# Patient Record
Sex: Male | Born: 1960 | Race: White | Hispanic: No | Marital: Married | Smoking: Former smoker
Health system: Southern US, Community
[De-identification: ages and names within clinical notes are randomized; demographics above are authoritative.]

## PROBLEM LIST (undated history)

## (undated) DIAGNOSIS — F17200 Nicotine dependence, unspecified, uncomplicated: Secondary | ICD-10-CM

## (undated) DIAGNOSIS — E039 Hypothyroidism, unspecified: Secondary | ICD-10-CM

## (undated) DIAGNOSIS — N400 Enlarged prostate without lower urinary tract symptoms: Secondary | ICD-10-CM

## (undated) DIAGNOSIS — R7612 Nonspecific reaction to cell mediated immunity measurement of gamma interferon antigen response without active tuberculosis: Secondary | ICD-10-CM

## (undated) DIAGNOSIS — R112 Nausea with vomiting, unspecified: Secondary | ICD-10-CM

## (undated) DIAGNOSIS — R12 Heartburn: Secondary | ICD-10-CM

## (undated) DIAGNOSIS — E785 Hyperlipidemia, unspecified: Secondary | ICD-10-CM

## (undated) DIAGNOSIS — Z9889 Other specified postprocedural states: Secondary | ICD-10-CM

## (undated) DIAGNOSIS — Z87438 Personal history of other diseases of male genital organs: Secondary | ICD-10-CM

## (undated) DIAGNOSIS — T7840XA Allergy, unspecified, initial encounter: Secondary | ICD-10-CM

## (undated) HISTORY — DX: Other specified postprocedural states: Z98.890

## (undated) HISTORY — DX: Hypothyroidism, unspecified: E03.9

## (undated) HISTORY — PX: OTHER SURGICAL HISTORY: SHX169

## (undated) HISTORY — PX: KNEE SURGERY: SHX244

## (undated) HISTORY — DX: Nonspecific reaction to cell mediated immunity measurement of gamma interferon antigen response without active tuberculosis: R76.12

## (undated) HISTORY — PX: WRIST SURGERY: SHX841

## (undated) HISTORY — PX: FINGER SURGERY: SHX640

## (undated) HISTORY — DX: Heartburn: R12

## (undated) HISTORY — DX: Nicotine dependence, unspecified, uncomplicated: F17.200

## (undated) HISTORY — DX: Allergy, unspecified, initial encounter: T78.40XA

## (undated) HISTORY — DX: Personal history of other diseases of male genital organs: Z87.438

## (undated) HISTORY — DX: Hyperlipidemia, unspecified: E78.5

## (undated) HISTORY — DX: Benign prostatic hyperplasia without lower urinary tract symptoms: N40.0

## (undated) HISTORY — DX: Nausea with vomiting, unspecified: R11.2

---

## 2004-09-06 ENCOUNTER — Emergency Department: Payer: Self-pay | Admitting: Unknown Physician Specialty

## 2004-09-10 ENCOUNTER — Emergency Department: Payer: Self-pay | Admitting: Emergency Medicine

## 2005-03-11 ENCOUNTER — Emergency Department (HOSPITAL_COMMUNITY): Admission: EM | Admit: 2005-03-11 | Discharge: 2005-03-11 | Payer: Self-pay | Admitting: Emergency Medicine

## 2005-08-12 ENCOUNTER — Ambulatory Visit: Payer: Self-pay | Admitting: Internal Medicine

## 2008-01-31 ENCOUNTER — Ambulatory Visit: Payer: Self-pay | Admitting: Family Medicine

## 2008-02-04 ENCOUNTER — Ambulatory Visit: Payer: Self-pay | Admitting: Family Medicine

## 2008-02-04 LAB — CONVERTED CEMR LAB
BUN: 8 mg/dL (ref 6–23)
Bilirubin Urine: NEGATIVE
CO2: 29 meq/L (ref 19–32)
Calcium: 9.1 mg/dL (ref 8.4–10.5)
Chloride: 104 meq/L (ref 96–112)
Creatinine, Ser: 1 mg/dL (ref 0.4–1.5)
GFR calc Af Amer: 103 mL/min
GFR calc non Af Amer: 85 mL/min
Glucose, Bld: 102 mg/dL — ABNORMAL HIGH (ref 70–99)
Glucose, Urine, Semiquant: NEGATIVE
Ketones, urine, test strip: NEGATIVE
Nitrite: POSITIVE
PSA: 10.94 ng/mL — ABNORMAL HIGH (ref 0.10–4.00)
Potassium: 5 meq/L (ref 3.5–5.1)
Protein, U semiquant: NEGATIVE
Sodium: 139 meq/L (ref 135–145)
Specific Gravity, Urine: 1.025
Urobilinogen, UA: 0.2
pH: 5.5

## 2008-02-05 ENCOUNTER — Encounter: Payer: Self-pay | Admitting: Family Medicine

## 2008-02-26 ENCOUNTER — Ambulatory Visit: Payer: Self-pay | Admitting: Family Medicine

## 2008-02-26 LAB — CONVERTED CEMR LAB: PSA: 1.72 ng/mL (ref 0.10–4.00)

## 2008-02-28 ENCOUNTER — Ambulatory Visit: Payer: Self-pay | Admitting: Family Medicine

## 2008-05-28 ENCOUNTER — Ambulatory Visit: Payer: Self-pay | Admitting: Internal Medicine

## 2008-05-28 DIAGNOSIS — J309 Allergic rhinitis, unspecified: Secondary | ICD-10-CM | POA: Insufficient documentation

## 2008-05-28 DIAGNOSIS — F17200 Nicotine dependence, unspecified, uncomplicated: Secondary | ICD-10-CM | POA: Insufficient documentation

## 2008-05-29 LAB — CONVERTED CEMR LAB: Glucose, Bld: 95 mg/dL (ref 70–99)

## 2008-08-06 ENCOUNTER — Encounter: Admission: RE | Admit: 2008-08-06 | Discharge: 2008-08-06 | Payer: Self-pay | Admitting: Internal Medicine

## 2009-07-02 ENCOUNTER — Encounter: Payer: Self-pay | Admitting: Internal Medicine

## 2009-07-03 ENCOUNTER — Encounter (INDEPENDENT_AMBULATORY_CARE_PROVIDER_SITE_OTHER): Payer: Self-pay | Admitting: *Deleted

## 2009-07-03 ENCOUNTER — Ambulatory Visit: Payer: Self-pay | Admitting: Internal Medicine

## 2009-07-03 DIAGNOSIS — R0609 Other forms of dyspnea: Secondary | ICD-10-CM | POA: Insufficient documentation

## 2009-07-03 DIAGNOSIS — R0989 Other specified symptoms and signs involving the circulatory and respiratory systems: Secondary | ICD-10-CM | POA: Insufficient documentation

## 2009-07-06 LAB — CONVERTED CEMR LAB
ALT: 14 units/L (ref 0–53)
AST: 18 units/L (ref 0–37)
Albumin: 4 g/dL (ref 3.5–5.2)
Alkaline Phosphatase: 59 units/L (ref 39–117)
BUN: 15 mg/dL (ref 6–23)
Basophils Absolute: 0.1 10*3/uL (ref 0.0–0.1)
Basophils Relative: 0.5 % (ref 0.0–3.0)
Bilirubin, Direct: 0.1 mg/dL (ref 0.0–0.3)
CO2: 30 meq/L (ref 19–32)
Calcium: 9.4 mg/dL (ref 8.4–10.5)
Chloride: 109 meq/L (ref 96–112)
Cholesterol: 205 mg/dL — ABNORMAL HIGH (ref 0–200)
Creatinine, Ser: 1.1 mg/dL (ref 0.4–1.5)
Direct LDL: 127.5 mg/dL
Eosinophils Absolute: 0.1 10*3/uL (ref 0.0–0.7)
Eosinophils Relative: 1.1 % (ref 0.0–5.0)
GFR calc non Af Amer: 75.99 mL/min (ref >60)
Glucose, Bld: 91 mg/dL (ref 70–99)
HCT: 40.3 % (ref 39.0–52.0)
HDL: 60.6 mg/dL (ref >39.00)
Hemoglobin: 13.4 g/dL (ref 13.0–17.0)
Lymphocytes Relative: 20.8 % (ref 12.0–46.0)
Lymphs Abs: 2.3 10*3/uL (ref 0.7–4.0)
MCHC: 33.3 g/dL (ref 30.0–36.0)
MCV: 81.6 fL (ref 78.0–100.0)
Monocytes Absolute: 0.9 10*3/uL (ref 0.1–1.0)
Monocytes Relative: 8.2 % (ref 3.0–12.0)
Neutro Abs: 7.6 10*3/uL (ref 1.4–7.7)
Neutrophils Relative %: 69.4 % (ref 43.0–77.0)
PSA: 0.96 ng/mL (ref 0.10–4.00)
Phosphorus: 3.9 mg/dL (ref 2.3–4.6)
Platelets: 265 10*3/uL (ref 150.0–400.0)
Potassium: 4.6 meq/L (ref 3.5–5.1)
RBC: 4.94 M/uL (ref 4.22–5.81)
RDW: 15.4 % — ABNORMAL HIGH (ref 11.5–14.6)
Sodium: 143 meq/L (ref 135–145)
TSH: 1.46 microintl units/mL (ref 0.35–5.50)
Total Bilirubin: 0.9 mg/dL (ref 0.3–1.2)
Total CHOL/HDL Ratio: 3
Total Protein: 6.5 g/dL (ref 6.0–8.3)
Triglycerides: 35 mg/dL (ref 0.0–149.0)
VLDL: 7 mg/dL (ref 0.0–40.0)
WBC: 10.9 10*3/uL — ABNORMAL HIGH (ref 4.5–10.5)

## 2009-07-13 ENCOUNTER — Encounter: Payer: Self-pay | Admitting: Gastroenterology

## 2009-09-15 ENCOUNTER — Ambulatory Visit: Payer: Self-pay | Admitting: Family Medicine

## 2009-09-15 DIAGNOSIS — S61209A Unspecified open wound of unspecified finger without damage to nail, initial encounter: Secondary | ICD-10-CM | POA: Insufficient documentation

## 2009-09-17 ENCOUNTER — Ambulatory Visit: Payer: Self-pay | Admitting: Family Medicine

## 2009-10-14 ENCOUNTER — Telehealth: Payer: Self-pay | Admitting: Family Medicine

## 2009-10-14 ENCOUNTER — Ambulatory Visit: Payer: Self-pay | Admitting: Family Medicine

## 2010-02-11 ENCOUNTER — Telehealth: Payer: Self-pay | Admitting: Internal Medicine

## 2010-02-16 NOTE — Assessment & Plan Note (Signed)
Summary: ROA FOR 2 DAY RECHECK OF FINGER/JRR   Vital Signs:  Patient profile:   50 year old male Height:      67 inches Weight:      142.75 pounds BMI:     22.44 Temp:     99.6 degrees F oral Pulse rate:   68 / minute Pulse rhythm:   regular BP sitting:   106 / 70  (left arm) Cuff size:   regular  Vitals Entered By: Delilah Shan CMA Duncan Dull) (September 17, 2009 10:31 AM) CC: Follow-up visit for 2 day recheck on finger   History of Present Illness: Wound recheck.  doing well.  less pain.  increase in ROM.  taking the antibiotics.  On light duty at work.   Allergies: No Known Drug Allergies  Review of Systems       See HPI.  Otherwise negative.    Physical Exam  General:  R hand with decrease in edema at R3rd prox phalx.  Wound appears to be healing and is clean.  Rebandaged here today.  Distally NV inctact with increase in ROM but still limited by pain and not fully back to normal.     Impression & Recommendations:  Problem # 1:  LACERATION OF FINGER (ICD-883.0) Improving.  No charge for visit.  Keep taking the antibiotics and daily dressing change with baidaid and neosporin.   continue light duty until resolved.  He understands.  follow up as needed.  Orders: No Charge Patient Arrived (NCPA0) (NCPA0)  Complete Medication List: 1)  Doxycycline Hyclate 100 Mg Caps (Doxycycline hyclate) .Marland Kitchen.. 1 by mouth two times a day x10d  Patient Instructions: 1)  Continue light duty until cut is healed and you are able to make a fist without pain.  Let me know if you have any continued pain or problems.  Keep taking the antibiotics.  Let me know if the area gets more swollen or red.  Take care.

## 2010-02-16 NOTE — Letter (Signed)
Summary: Previsit letter  [facility]  [address] [place], [place] [address]   Phone: [phone]  Fax: [phone]       07/03/2009 MRN: [id number]  Louis Evans [address] [place], [place]  [address]  Dear [name],  Welcome to the Gastroenterology Division at [facility].    You are scheduled to see a nurse for your pre-procedure visit on 07-13-09 at 2:30p.m. on the 3rd floor at [facility], [address]  We ask that you try to arrive at our office 15 minutes prior to your appointment time to allow for check-in.  Your nurse visit will consist of discussing your medical and surgical history, your immediate family medical history, and your medications.    Please bring a complete list of all your medications or, if you prefer, bring the medication bottles and we will list them.  We will need to be aware of both prescribed and over the counter drugs.  We will need to know exact dosage information as well.  If you are on blood thinners (Coumadin, Plavix, Aggrenox, Ticlid, etc.) please call our office today/prior to your appointment, as we need to consult with your physician about holding your medication.   Please be prepared to read and sign documents such as consent forms, a financial agreement, and acknowledgement forms.  If necessary, and with your consent, a friend or relative is welcome to sit-in on the nurse visit with you.  Please bring your insurance card so that we may make a copy of it.  If your insurance requires a referral to see a specialist, please bring your referral form from your primary care physician.  No co-pay is required for this nurse visit.     If you cannot keep your appointment, please call [phone] to cancel or reschedule prior to your appointment date.  This allows [place] the opportunity to schedule an appointment for another patient in need of care.    Thank you for choosing [facility] for your  medical needs.  We appreciate the opportunity to care for you.  Please visit [place] at our website  to learn more about our practice.                     Sincerely.                                                                                                                   The Gastroenterology Division

## 2010-02-16 NOTE — Assessment & Plan Note (Signed)
Summary: ?UTI/CLE   Vital Signs:  Patient Profile:   50 Years Old Male Weight:      143.13 pounds (65.06 kg) Temp:     98.6 degrees F (37.00 degrees C) oral Pulse rate:   88 / minute Pulse rhythm:   regular BP sitting:   100 / 70  (left arm) Cuff size:   regular  Vitals Entered By: Silas Sacramento (January 31, 2008 10:02 AM)                 Chief Complaint:  chills and problem urinating.  History of Present Illness: 50 year old male with probs urinating:  1. URI: the patient started to have some chills, arthralgias yesterday as well as rhinorrhea and cough. He had no fever, but did have chills and sweats. He feels somewhat weak and is having arthralgias diffusely. He feels he is having symptoms consistent with a cold. He did take some Sudafed for his cold.  2. Urinary retention: the patient was gotten up 3 times since early in the morning when he was asleep to try to go to the bathroom it is felt as if he has had to go the bathroom, and he was unable to go. Since that time from the earlier morning until now, the patient has felt a progressive pressure in his bladder and feels as if he has to go. He does have some dribbling from the end of his penis, however he is unable to void. He was able to go minimally, however he is under the unable to evacuate his bladder. He is never had a similar problem. Other prosthetic problems include a history of prostatitis in the past. No exposure to STDs to the patient's knowledge. He has not been unfaithful.     Past Medical History:    Reviewed history and no changes required:       history prostatitis       BPH     Review of Systems      See HPI  General      Complains of chills, fatigue, and sweats.      Denies fever.  ENT      Complains of nasal congestion and postnasal drainage.  Resp      Complains of cough.      Denies shortness of breath, sputum productive, and wheezing.  GI      Denies nausea and vomiting.  GU      See  HPI      Complains of urinary hesitancy.      Denies discharge, dysuria, erectile dysfunction, genital sores, hematuria, incontinence, nocturia, and urinary frequency.   Physical Exam  General:     Well-developed,well-nourished,in no acute distress; alert,appropriate and cooperative throughout examination Head:     Normocephalic and atraumatic without obvious abnormalities. No apparent alopecia or balding. Ears:     no external deformities.   Lungs:     Normal respiratory effort, chest expands symmetrically. Lungs are clear to auscultation, no crackles or wheezes. Heart:     Normal rate and regular rhythm. S1 and S2 normal without gallop, murmur, click, rub or other extra sounds. Abdomen:     Bowel sounds positive,abdomen soft and non-tender without masses, organomegaly or hernias noted. Genitalia:     the patient's penis is intact, nontender throughout palpation, nontender throughout testicular examination, nontender along epididymis. There is a slight amount of dribbling urine from the end of his urethra Prostate:     diffusely, quite enlarged prostate. Nontender  to palpation. There is no nodules. No asymmetry. Extremities:     No clubbing, cyanosis, edema, or deformity noted with normal full range of motion of all joints.   Neurologic:     alert & oriented X3 and gait normal.   Psych:     slightly anxious, and does appear appropriately concerned.    Impression & Recommendations:  Problem # 1:  URINARY RETENTION (ICD-788.20) Assessment: New  patient was Flomax 0.4 mg, instructed to take 2 tablets immediately. Then 1 tab daily  The patient did not have resolution of urinary retention, within the next several hours into the early evening hours, he was instructed to go to the emergency room and would need to have an emergent catheterization for urinary retention.  The patient has taken some Sudafed, given his at quite enlarged prostate, this seems the most likely  cause.  Followup early next week, the patient should have a followup BMP. If he has continued to have problems, and has had worsening of his renal function, he will need to have a renal ultrasound as well. Given the size of his prostate, he should have a prostate-specific antigen at his followup visit as well.  I also discussed this case with Dr. Verdell Face  Problem # 2:  UPPER RESPIRATORY INFECTION (ICD-465.9) Assessment: New supportive care only, the patient was instructed to take no over-the-counter cold medicines, given concern and his urinary retention. Tylenol as needed.  Complete Medication List: 1)  Flomax 0.4 Mg Cp24 (Tamsulosin hcl) .... Take 1 capsule by mouth daily   Patient Instructions: 1)  To ER if can't empty bladder by tonight 2)  Follow-up on Monday 3)  Take 2 Flomax tablets as soon as get filled, then 1 a day   Prescriptions: FLOMAX 0.4 MG CP24 (TAMSULOSIN HCL) Take 1 capsule by mouth daily  #30 x 3   Entered and Authorized by:   Hannah Beat MD   Signed by:   Hannah Beat MD on 01/31/2008   Method used:   Print then Give to Patient   RxID:   [id number]

## 2010-02-16 NOTE — Assessment & Plan Note (Signed)
Summary: HAND STILL NOT BETTER/DLO   Vital Signs:  Patient profile:   50 year old male Height:      67 inches Weight:      142.38 pounds BMI:     22.38 Temp:     99.1 degrees F oral Pulse rate:   64 / minute Pulse rhythm:   regular BP sitting:   106 / 72  (left arm) Cuff size:   regular  Vitals Entered By: Melody Comas (October 14, 2009 2:53 PM) CC: follow up on hand   History of Present Illness: Skin fully healed about 2 weeks ago from prev injury on prox phalynx of R3rd finger.  Not with constant pain at rest, but occ throbbing.  Pain if patient makes a fist or with full extension.  ROM and strength limited by pain. Distaly 3rd digit is numb when patient is working/using hands.  Numbness better wiht massage/rest.  R handed.    L 2nd finger with laceration over the weekend; didn't occur at work.  Skin flap was still attached so the patient covered the area.  No FCNAV.  No discharge.   Current Medications (verified): 1)  None  Allergies: No Known Drug Allergies  Review of Systems       See HPI.  Otherwise negative.    Physical Exam  General:  NAD R hand with change in sensation on 3rd finger, middle and distally- decrease in monofilament.  Lac is healed.  no tendon deficit on flex/ext.  able to make a fist, but with discomfort.  L 2nd distal lac with flap attached.  No erythema, no discharge.  Appears to be healing.  Lac about  ~1cm.    Impression & Recommendations:  Problem # 1:  LACERATION OF FINGER (ICD-883.0)  Will refer to hand for further eval given the numbness.  He agrees.  L hand rebandaged.  Fu as needed for this.  It is healing though the flap may slough off.   Orders: Orthopedic Referral (Ortho)  Patient Instructions: 1)  See Shirlee Limerick about your referral before your leave today.  Keep the cut on your finger clean and covered.

## 2010-02-16 NOTE — Assessment & Plan Note (Signed)
Summary: F/U DLO   Vital Signs:  Patient Profile:   50 Years Old Male Weight:      146.13 pounds (66.42 kg) Temp:     97.9 degrees F (36.61 degrees C) oral Pulse rate:   68 / minute Pulse rhythm:   regular BP sitting:   100 / 80  (left arm) Cuff size:   regular  Vitals Entered By: Silas Sacramento (February 04, 2008 8:17 AM)                 Chief Complaint:  f/u.  History of Present Illness: birth pleasant 50 year old white male well known to me here in followup for urinary retention. After he took his Flomax it was given to him on Friday, the patient was continually able to urinate and was able to fully void his bladder by approximately 7 PM.  He continues to have some urgency and some dysuria throughout the weekend. He states that his urine has smelled foul and looked discolored.  He does not have a known history of frequent urinary tract infections.    Past Medical History:    Reviewed history from 01/31/2008 and no changes required:       history prostatitis       BPH       Tobacco abuse     Review of Systems      See HPI  General      Denies chills, fever, and sweats.  GU      Complains of dysuria, urinary frequency, and urinary hesitancy.      Denies discharge, genital sores, and hematuria.   Physical Exam  General:     Well-developed,well-nourished,in no acute distress; alert,appropriate and cooperative throughout examination Head:     Normocephalic and atraumatic without obvious abnormalities. No apparent alopecia or balding. Ears:     no external deformities.   Nose:     no external deformity.   Lungs:     normal respiratory effort.   Abdomen:     Bowel sounds positive,abdomen soft and non-tender without masses, organomegaly or hernias noted. Extremities:     No clubbing, cyanosis, edema, or deformity noted with normal full range of motion of all joints.   Neurologic:     alert & oriented X3 and gait normal.   Psych:     Cognition and  judgment appear intact. Alert and cooperative with normal attention span and concentration. No apparent delusions, illusions, hallucinations    Impression & Recommendations:  Problem # 1:  DYSURIA (ICD-788.1) Assessment: New Clinical history, LE+, N+, Blood in urine.  Recent retention, will treat a little longer - Cipro 250 mg by mouth x 7 days  His updated medication list for this problem includes:    Ciprofloxacin Hcl 250 Mg Tabs (Ciprofloxacin hcl) .Marland Kitchen... 1 by mouth q day  Orders: UA Dipstick w/o Micro (manual) (95284) T-Culture, Urine (13244-01027)   Problem # 2:  URINARY RETENTION (ICD-788.20) Assessment: Improved Resolved, cont. Flomax.  Will check BMP to ensure kidney function is normal Large prostate on exam, check PSA, r/o CA  Orders: Venipuncture (25366) TLB-BMP (Basic Metabolic Panel-BMET) (80048-METABOL) TLB-PSA (Prostate Specific Antigen) (84153-PSA)   Complete Medication List: 1)  Flomax 0.4 Mg Cp24 (Tamsulosin hcl) .... Take 1 capsule by mouth daily 2)  Ciprofloxacin Hcl 250 Mg Tabs (Ciprofloxacin hcl) .Marland Kitchen.. 1 by mouth q day    Prescriptions: CIPROFLOXACIN HCL 250 MG TABS (CIPROFLOXACIN HCL) 1 by mouth q day  #7 x 0   Entered and  Authorized by:   Hannah Beat MD   Signed by:   Hannah Beat MD on 02/04/2008   Method used:   Print then Give to Patient   RxID:   [id number]   Laboratory Results   Urine Tests  Date/Time Received: February 04, 2008 8:24 AM Date/Time Reported: February 04, 2008 8:24 AM  Routine Urinalysis   Color: yellow Appearance: Cloudy Glucose: negative   (Normal Range: Negative) Bilirubin: negative   (Normal Range: Negative) Ketone: negative   (Normal Range: Negative) Spec. Gravity: 1.025   (Normal Range: 1.003-1.035) Blood: large   (Normal Range: Negative) pH: 5.5   (Normal Range: 5.0-8.0) Protein: negative   (Normal Range: Negative) Urobilinogen: 0.2   (Normal Range: 0-1) Nitrite: positive   (Normal Range:  Negative) Leukocyte Esterace: large   (Normal Range: Negative)

## 2010-02-16 NOTE — Assessment & Plan Note (Signed)
Summary: ROA   Vital Signs:  Patient Profile:   50 Years Old Male Weight:      144.38 pounds (65.63 kg) Temp:     98.5 degrees F (36.94 degrees C) oral Pulse rate:   60 / minute Pulse rhythm:   regular BP sitting:   120 / 80  (left arm) Cuff size:   regular  Vitals Entered By: Silas Sacramento (February 28, 2008 3:20 PM)                 Chief Complaint:  f/u.  History of Present Illness: 50 yo pleasant gentleman here to f/u on PSA, prior psa was 10. On repeat post-ABX course, the patient's PSA was 1.7.  Feels well now. Continues with FLomax. Good urine flow. No pain.       Review of Systems      See HPI   Physical Exam  General:     Gen: Well-developed,well-nourished,in no acute distress; alert,appropriate and cooperative throughout examination  HEENT: Normocephalic and atraumatic without obvious abnormalities. No apparent alopecia or balding. Ears, externally no deformities  Pulm: Breathing comfortably in no respiratory distress  Ext: No clubbing, cyanosis, or edema  Psych: Normally interactive. Cooperative during the interview. Pleasant. Friendly and conversant. Not anxious or depressed appearing. Normal, full affect.     Impression & Recommendations:  Problem # 1:  PSA, INCREASED (ICD-790.93) >15 minutes spent in total face to face time with the patient with >50% of time spent in counselling and coordination of care: reviewed PSA, false positives, prostate anatomy, prostatitis, UTI's, overall health, alcohol consumption, and cigarette use.  PSA WNL now  Complete Medication List: 1)  Flomax 0.4 Mg Cp24 (Tamsulosin hcl) .... Take 1 capsule by mouth daily

## 2010-02-16 NOTE — Assessment & Plan Note (Signed)
Summary: CPX/CYD   Vital Signs:  Patient profile:   50 year old male Height:      67 inches Weight:      142 pounds BMI:     22.32 Temp:     98.3 degrees F oral Pulse rate:   60 / minute Pulse rhythm:   regular BP sitting:   120 / 70  (left arm) Cuff size:   regular  Vitals Entered By: Mervin Hack CMA (May 28, 2008 9:27 AM)  History of Present Illness: Chief Complaint: adult physical  Had prostate infection took antibiotic and improved took the med for 1 month and then stoppped Still with very slow stream in AM No problems during day--no urgency or troublesome frequency No sexual problems  counselled on cigarette cessation has stopped on patch in past discussed meds  some allergy symptoms  Preventive Screening-Counseling & Management     Smoking Status: current     Smoking Cessation Counseling: YES  Allergies (verified): No Known Drug Allergies  Past History:  Past Medical History:    BPH    Tobacco abuse    Allergic rhinitis  Past Surgical History:    Denies surgical history  Family History:    Dad died @70  of brain cancer    Mom with HTN, DM    1 brother, 3 sisters    No CAD    HTN in mom and mat GM    No prostate or colon cancer  Social History:    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 Eyes:  Denies double vision and vision loss-1 eye. ENT:  Complains of decreased hearing and ringing in ears; mild hearing loss--stable Poor teeth--will have to pull remaing 4 upper teeth. CV:  Complains of shortness of breath with exertion; denies chest pain or discomfort, difficulty breathing at night, difficulty breathing while lying down, fainting, lightheadness, and near fainting; stable DOE. Resp:   Denies cough and shortness of breath. GI:  Complains of indigestion; denies abdominal pain, bloody stools, change in bowel habits, dark tarry stools, nausea, and vomiting; Tums after spicy sauces. GU:  Complains of erectile dysfunction; denies urinary hesitancy. MS:  Denies joint pain and joint swelling. Derm:  Denies lesion(s) and rash. Neuro:  Denies headaches, numbness, tingling, and weakness. Psych:  Denies anxiety and depression; always "hot headed" --nothing new. Endo:  Denies cold intolerance and heat intolerance. Heme:  Denies abnormal bruising and enlarge lymph nodes. Allergy:  Complains of seasonal allergies and sneezing.  Physical Exam  General:  alert and normal appearance.   Eyes:  pupils equal, pupils round, pupils reactive to light, and no optic disk abnormalities.   Ears:  R ear normal and L ear normal.   Mouth:  no erythema and no lesions.   Neck:  supple, no masses, no thyromegaly, no carotid bruits, and no cervical lymphadenopathy.   Lungs:  normal respiratory effort and normal breath sounds.   Heart:  normal rate, regular rhythm, no murmur, and no gallop.   Abdomen:  soft, non-tender, and no masses.   Msk:  no joint tenderness and no joint swelling.   Pulses:  1+ in feet Extremities:  no edema Neurologic:  alert & oriented X3 and strength normal in all extremities.   Skin:  no rashes and no suspicious lesions.   Axillary Nodes:  No palpable lymphadenopathy Psych:  normally interactive, good eye contact, not anxious appearing, and not depressed appearing.     Impression & Recommendations:  Problem # 1:  PREVENTIVE HEALTH CARE (ICD-V70.0) Assessment Comment Only  discussed fitness cigarette cessation will check glucose colon next year  Orders: TLB-Glucose, QUANT (82947-GLU) Venipuncture ([address])  Problem # 2:  TOBACCO ABUSE (ICD-305.1) Assessment: Comment Only  counselling done  Orders: Tobacco use cessation intermediate 3-10 minutes  (99406)  Complete Medication List: 1)  Flomax 0.4 Mg Cp24 (Tamsulosin hcl) .... Take 1 capsule by mouth daily  Patient Instructions: 1)  Tobacco is very bad for your health and your loved ones ! You should stop smoking !  2)  Stop smoking tips: Choose a quit date. Cut down before the quit date. Start the nicotine patch 21mg  daily and use daily for at least 2 months but can go as long as 6 months. Use nicotine gum or lozenge if you have an urge for a cigarette but absolutely DON'T SMOKE!! 3)  For allergies, take cetirizine 10mg  daily or loratadine 10mg  1-2 daily 4)  Please schedule a follow-up appointment in 1 year.     Current Allergies (reviewed today): No known allergies   Immunization History:  Tetanus/Td Immunization History:    Tetanus/Td:  Td (01/17/2002)

## 2010-02-16 NOTE — Letter (Signed)
Summary: Pre Visit No Show Letter  [facility]  [address] [place], [place] [address]   Phone: [phone]  Fax: [phone]        July 13, 2009 MRN: [id number]    Louis Evans [address] [place], [place]  [address]    Dear Mr. Vizzini,   We have been unable to reach you by phone concerning the pre-procedure visit that you missed on 07/13/09. For this reason,your procedure scheduled on 07/27/09 has been cancelled. Our scheduling staff will gladly assist you with rescheduling your appointments at a more convenient time. Please call our office at [phone] between the hours of 8:00am and 5:00pm, press option #2 to reach an appointment scheduler. Please consider updating your contact numbers at this time so that we can reach you by phone in the future with schedule changes or results.    Thank you,    Wyona Almas RN [facility]

## 2010-02-16 NOTE — Assessment & Plan Note (Signed)
Summary: cut finger x week ago, still swollen/dlo   Vital Signs:  Patient profile:   50 year old male Height:      67 inches Weight:      142.75 pounds BMI:     22.44 Temp:     98.4 degrees F oral Pulse rate:   68 / minute Pulse rhythm:   regular BP sitting:   112 / 70  (right arm) Cuff size:   regular  Vitals Entered By: Delilah Shan CMA Duncan Dull) (September 15, 2009 10:49 AM) CC: Cut finger 1 week ago, still swollen   History of Present Illness: Td 2004. DOI 09/07/2009 at work.  Cut R hand.  No stitches.  Has bandaged it in the meantime.  Area is swollen but w/o discharge.  Dec in ROM due to pain.  No FCNAV.  No other c/o.   Allergies: No Known Drug Allergies  Review of Systems       See HPI.  Otherwise negative.    Physical Exam  General:  NAD R hand with 1.5 cm lac on prox phalynx, R3rd finger.  Local edema noted with rim of erythema around the lac. Lac is clean appearing and not contaminated.  Some decrease in ROM at the MCP/IP joints but no laxity on ligament testing.  distally NV intact.  Normal radial pulse.    Impression & Recommendations:  Problem # 1:  LACERATION OF FINGER (ICD-883.0) Tdap today and start doxy.  Keep area bandaged.  Not able to suture now.  Will follow up in 48 hours, sooner as needed.  Out of work if they cannot provide light duty.  He understands the plan.  Will need to heal by secondary intention.   Complete Medication List: 1)  Doxycycline Hyclate 100 Mg Caps (Doxycycline hyclate) .Marland Kitchen.. 1 by mouth two times a day x10d  Other Orders: Tdap => 30yrs IM (16109) Admin 1st Vaccine (60454)  Patient Instructions: 1)  Keep the area bandaged with neosporin.  Come back for recheck in 2 days.  Let me know if it gets bigger or redder in the meantime.   2)  Keep patient on light duty until cut is healed.  No lifting or working with R hand in meantime. If light duty cannot be provided, then patient is out of work until healed.   Prescriptions: DOXYCYCLINE  HYCLATE 100 MG CAPS (DOXYCYCLINE HYCLATE) 1 by mouth two times a day x10d  #20 x 0   Entered and Authorized by:   Crawford Givens MD   Signed by:   Crawford Givens MD on 09/15/2009   Method used:   Electronically to        [facility] [id number] (retail[facility]       [address]       [place], [place]  [address]       Ph: [phone] or [phone]       Fax: [phone]   RxID:   [id number]   Prior Medications (reviewed today): None Current Allergies (reviewed today): No known allergies    Immunizations Administered:  Tetanus Vaccine:    Vaccine Type: Tdap    Site: left deltoid    Mfr: GlaxoSmithKline    Dose: 0.5 ml    Route: IM    Given by: Delilah Shan CMA (AAMA)    Exp. Date: 11/06/2011    Lot #: [id number]    VIS given: 12/05/06 version given September 15, 2009.

## 2010-02-16 NOTE — Assessment & Plan Note (Signed)
Summary: CPX.Marland KitchenCYD   Vital Signs:  Patient profile:   50 year old male Weight:      144 pounds BMI:     22.64 Temp:     98.6 degrees F oral Pulse rate:   68 / minute Pulse rhythm:   regular BP sitting:   100 / 60  (left arm) Cuff size:   large  Vitals Entered By: Mervin Hack CMA Duncan Dull) (July 03, 2009 9:44 AM) CC: adult physical   History of Present Illness: DOing well  Still smoking "I go crazy without it" Not ready now but knows he should Gets DOE --he knows    Allergies: No Known Drug Allergies  Past History:  Past medical, surgical, family and social histories (including risk factors) reviewed for relevance to current acute and chronic problems.  Past Medical History: Reviewed history from 05/28/2008 and no changes required. BPH Tobacco abuse Allergic rhinitis  Past Surgical History: Reviewed history from 05/28/2008 and no changes required. Denies surgical history  Family History: Reviewed history from 05/28/2008 and no changes required. Dad died @70  of brain cancer Mom with HTN, DM 1 brother, 3 sisters No CAD HTN in mom and mat GM No prostate or colon cancer  Social History: Reviewed history from 05/28/2008 and no changes required. 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 Eyes:  Denies double vision and vision loss-1 eye. ENT:  Complains of decreased hearing and ringing in ears; losing teeth--working with dentist. Will need upper plate and partial on bottom. CV:  Complains of shortness of breath with exertion; denies chest pain or discomfort, difficulty breathing at night, difficulty breathing while lying down, fainting, lightheadness, and palpitations. Resp:  Denies cough and shortness of breath. GI:  Complains of indigestion; denies abdominal pain,  bloody stools, change in bowel habits, dark tarry stools, nausea, and vomiting; occ heartburn--tums helps. GU:  Complains of nocturia and urinary hesitancy; denies erectile dysfunction; stream is slow rare nocturia. MS:  Complains of joint pain; denies joint swelling; right 3rd finger pain---had past injury. Derm:  Denies lesion(s) and rash. Neuro:  Denies headaches, numbness, tingling, and weakness. Psych:  Denies anxiety and depression; still quick tempered . Heme:  Denies abnormal bruising and enlarge lymph nodes. Allergy:  Complains of seasonal allergies and sneezing; occ problems since being in [place].  Physical Exam  General:  alert and normal appearance.   Eyes:  pupils equal, pupils round, pupils reactive to light, and no optic disk abnormalities.   Ears:  R ear normal and L ear normal.   Mouth:  no erythema, no exudates, no lesions, and poor dentition.   Neck:  supple, no masses, no thyromegaly, no carotid bruits, and no cervical lymphadenopathy.   Lungs:  normal respiratory effort and normal breath sounds.   Heart:  normal rate, regular rhythm, no murmur, and no gallop.   Abdomen:  soft, non-tender, and no masses.   Rectal:  no hemorrhoids and no masses.   Prostate:  no gland enlargement and no nodules.   Msk:  Mild nodular changes on right PIP joints no joint tenderness.   Pulses:  2+ in feet Extremities:  no edema Neurologic:  alert & oriented X3, strength normal in all extremities, and gait normal.   Skin:  no rashes and no suspicious lesions.   Axillary Nodes:  No palpable lymphadenopathy Psych:  normally interactive, good eye contact, not anxious appearing, and not depressed appearing.   Additional Exam:  Spirometry normal   Impression & Recommendations:  Problem # 1:  PREVENTIVE HEALTH CARE (ICD-V70.0) Assessment Comment Only healthy will schedule colon PSA  Problem # 2:  DYSPNEA/SHORTNESS OF BREATH (ICD-786.09) Assessment: New  probably  deconditioning discussed fitness   Orders: Spirometry w/Graph (94010) EKG w/ Interpretation (93000) Venipuncture (16109) TLB-Renal Function Panel (80069-RENAL) TLB-CBC Platelet - w/Differential (85025-CBCD) TLB-Hepatic/Liver Function Pnl (80076-HEPATIC) TLB-TSH (Thyroid Stimulating Hormone) (84443-TSH) TLB-Lipid Panel (80061-LIPID)  Problem # 3:  TOBACCO ABUSE (ICD-305.1) Assessment: Comment Only counselled  ~5 minutes about cessation  Other Orders: TLB-PSA (Prostate Specific Antigen) (84153-PSA) Gastroenterology Referral (GI)  Patient Instructions: 1)  Please use the 21mg  nicotine patch for at least 3 months. Use a nicotine lozenge as well for any time you get a strong urge so that you never light a cigarette again. 2)  Please schedule a follow-up appointment in 1 year.  3)  Schedule a colonoscopy/ sigmoidoscopy to help detect colon cancer.   Prior Medications: Current Allergies (reviewed today): No known allergies    EKG  Procedure date:  07/03/2009  Findings:      siinus bradycardia @55  otherwise normal

## 2010-02-16 NOTE — Progress Notes (Signed)
----   Converted from flag ---- ---- 10/14/2009 3:40 PM, Carlton Adam wrote: Dr Para March, This patient was hurt on the job, therfore it is a workmans comp case. [facility] would not make him an appt as he needs to pursure the [facility] and get approved before they would see him. I told him what he will need to do this and told him to call me if he needed any more help. MK ------------------------------ Noted.

## 2010-02-18 NOTE — Progress Notes (Signed)
Summary: order for colonoscopy  Phone Note Call from Patient Call back at Home Phone [phone]   Caller: Patient Call For: Cindee Salt MD Summary of Call: Patient is asking if he could get order for colonolscopy sent to Dr. Noe Gens in [place]  where he has always gone in the past.  Initial call taken by: Melody Comas,  February 11, 2010 1:57 PM  Follow-up for Phone Call        okay to make referral  Follow-up by: Cindee Salt MD,  February 11, 2010 2:52 PM

## 2011-01-18 DIAGNOSIS — R7612 Nonspecific reaction to cell mediated immunity measurement of gamma interferon antigen response without active tuberculosis: Secondary | ICD-10-CM

## 2011-01-18 HISTORY — DX: Nonspecific reaction to cell mediated immunity measurement of gamma interferon antigen response without active tuberculosis: R76.12

## 2011-10-10 ENCOUNTER — Other Ambulatory Visit: Payer: Self-pay | Admitting: Infectious Diseases

## 2011-10-10 ENCOUNTER — Ambulatory Visit
Admission: RE | Admit: 2011-10-10 | Discharge: 2011-10-10 | Disposition: A | Discharge status: Home / Self Care | Payer: No Typology Code available for payment source | Source: Ambulatory Visit | Attending: Infectious Diseases | Admitting: Infectious Diseases

## 2011-10-10 DIAGNOSIS — R7611 Nonspecific reaction to tuberculin skin test without active tuberculosis: Secondary | ICD-10-CM

## 2012-06-07 ENCOUNTER — Encounter: Payer: Self-pay | Admitting: Internal Medicine

## 2012-06-18 ENCOUNTER — Encounter: Payer: Self-pay | Admitting: Internal Medicine

## 2012-06-18 ENCOUNTER — Ambulatory Visit (INDEPENDENT_AMBULATORY_CARE_PROVIDER_SITE_OTHER): Payer: BC Managed Care – PPO | Admitting: Internal Medicine

## 2012-06-18 VITALS — BP 110/80 | HR 60 | Temp 98.0°F | Ht 68.0 in | Wt 152.0 lb

## 2012-06-18 DIAGNOSIS — Z0000 Encounter for general adult medical examination without abnormal findings: Secondary | ICD-10-CM

## 2012-06-18 DIAGNOSIS — R39198 Other difficulties with micturition: Secondary | ICD-10-CM | POA: Insufficient documentation

## 2012-06-18 DIAGNOSIS — R3989 Other symptoms and signs involving the genitourinary system: Secondary | ICD-10-CM

## 2012-06-18 DIAGNOSIS — E785 Hyperlipidemia, unspecified: Secondary | ICD-10-CM | POA: Insufficient documentation

## 2012-06-18 LAB — POCT URINALYSIS DIPSTICK
Bilirubin, UA: NEGATIVE
Blood, UA: NEGATIVE
Glucose, UA: NEGATIVE
Leukocytes, UA: NEGATIVE
Nitrite, UA: NEGATIVE
Protein, UA: NEGATIVE
Spec Grav, UA: 1.02
Urobilinogen, UA: NEGATIVE
pH, UA: 7

## 2012-06-18 NOTE — Progress Notes (Signed)
Subjective:    Patient ID: Louis Evans, male    DOB: [birth year], 51 y.o.   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   No current facility-administered medications on file prior to visit.    No Known Allergies  Past Medical History  Diagnosis Date  . History of BPH   . Tobacco use disorder   . Allergy     No past surgical history on file.  Family History  Problem Relation Age of Onset  . Hypertension Mother   . Diabetes Mother   . CAD Neg Hx   . Cancer Neg Hx     prostate or colon    History   Social History  . Marital Status: Married    Spouse Name: N/A    Number of Children: 3  . Years of Education: N/A   Occupational History  .  [employer]   Social History Main Topics  . Smoking status: Former Smoker    Quit date: 09/21/2011  . Smokeless tobacco: Never Used  . Alcohol Use: Yes  . Drug Use: No  . Sexually Active: Not on file   Other Topics Concern  . Not on file   Social History Narrative  . No narrative on file   Review of Systems  Constitutional: Positive for unexpected weight change. Negative for fatigue.       Weight is up a few pounds--stopped smoking Wears seat belt  HENT: Positive for hearing loss, congestion and tinnitus. Negative for rhinorrhea and dental problem.        New plate on top---still sees dentist for lowers Throat "killing me" --thinks it is allergies.  Eyes: Negative for visual disturbance.  Respiratory: Negative for cough, chest tightness and shortness of breath.   Cardiovascular: Negative for chest pain, palpitations and leg swelling.   Gastrointestinal: Negative for nausea, vomiting, abdominal pain, constipation and blood in stool.       Occ heartburn if he has tomato sauce, etc----tums relieves  Endocrine: Negative for cold intolerance, heat intolerance and polydipsia.  Genitourinary: Positive for frequency and difficulty urinating.       Trying saw palmetto for his prostate   Musculoskeletal: Negative for back pain, joint swelling and arthralgias.  Skin: Negative for rash.       No suspicious lesions  Allergic/Immunologic: Positive for environmental allergies. Negative for immunocompromised state.       Has meds but hasn't been taking  Neurological: Negative for dizziness, syncope, weakness, light-headedness, numbness and headaches.  Hematological: Negative for adenopathy. Does not bruise/bleed easily.  Psychiatric/Behavioral: Negative for sleep disturbance and dysphoric mood. The patient is not nervous/anxious.        Objective:   Physical Exam  Constitutional: He is oriented to person, place, and time. He appears well-developed and well-nourished. No distress.  HENT:  Head: Normocephalic and atraumatic.  Right Ear: External ear normal.  Left Ear: External ear normal.  Mouth/Throat: Oropharynx is clear and moist. No oropharyngeal exudate.  Eyes: Conjunctivae and EOM are normal. Pupils are equal, round, and reactive to light.  Neck: Normal range of motion. Neck supple. No thyromegaly present.  Cardiovascular: Normal rate, regular rhythm, normal heart  sounds and intact distal pulses.  Exam reveals no gallop.   No murmur heard. Pulmonary/Chest: Effort normal and breath sounds normal. No respiratory distress. He has no wheezes. He has no rales.  Abdominal: Soft. There is no tenderness.  Genitourinary:  Normal sized prostate with median sulcus easily palpable No tenderness  Musculoskeletal: He exhibits no edema and no tenderness.  Lymphadenopathy:    He has no cervical adenopathy.  Neurological: He is alert and  oriented to person, place, and time.  Skin: No rash noted. No erythema.  Psychiatric: He has a normal mood and affect. His behavior is normal.          Assessment & Plan:

## 2012-06-18 NOTE — Assessment & Plan Note (Signed)
Generally healthy Has quit smoking --congrats! Will check PSA after discussion

## 2012-06-18 NOTE — Assessment & Plan Note (Signed)
Mild  Will just recheck levels

## 2012-06-18 NOTE — Assessment & Plan Note (Addendum)
Seems like it is just from BPH Exam not consistent with infection Urinalysis benign He will try saw palmetto Consider tamsulosin

## 2012-06-19 LAB — HEPATIC FUNCTION PANEL
ALT: 23 U/L (ref 0–53)
AST: 31 U/L (ref 0–37)
Albumin: 4.1 g/dL (ref 3.5–5.2)
Alkaline Phosphatase: 66 U/L (ref 39–117)
Bilirubin, Direct: 0.2 mg/dL (ref 0.0–0.3)
Total Bilirubin: 1.5 mg/dL — ABNORMAL HIGH (ref 0.3–1.2)
Total Protein: 7 g/dL (ref 6.0–8.3)

## 2012-06-19 LAB — PSA: PSA: 0.84 ng/mL (ref 0.10–4.00)

## 2012-06-19 LAB — LIPID PANEL
Cholesterol: 253 mg/dL — ABNORMAL HIGH (ref 0–200)
HDL: 73.3 mg/dL (ref >39.00)
Total CHOL/HDL Ratio: 3
Triglycerides: 57 mg/dL (ref 0.0–149.0)
VLDL: 11.4 mg/dL (ref 0.0–40.0)

## 2012-06-19 LAB — CBC WITH DIFFERENTIAL/PLATELET
Basophils Absolute: 0 10*3/uL (ref 0.0–0.1)
Basophils Relative: 0.2 % (ref 0.0–3.0)
Eosinophils Absolute: 0.1 10*3/uL (ref 0.0–0.7)
Eosinophils Relative: 0.6 % (ref 0.0–5.0)
HCT: 40.7 % (ref 39.0–52.0)
Hemoglobin: 13.4 g/dL (ref 13.0–17.0)
Lymphocytes Relative: 17.6 % (ref 12.0–46.0)
Lymphs Abs: 2.1 10*3/uL (ref 0.7–4.0)
MCHC: 32.9 g/dL (ref 30.0–36.0)
MCV: 78.8 fl (ref 78.0–100.0)
Monocytes Absolute: 0.6 10*3/uL (ref 0.1–1.0)
Monocytes Relative: 5 % (ref 3.0–12.0)
Neutro Abs: 8.9 10*3/uL — ABNORMAL HIGH (ref 1.4–7.7)
Neutrophils Relative %: 76.6 % (ref 43.0–77.0)
Platelets: 258 10*3/uL (ref 150.0–400.0)
RBC: 5.16 Mil/uL (ref 4.22–5.81)
RDW: 14.9 % — ABNORMAL HIGH (ref 11.5–14.6)
WBC: 11.6 10*3/uL — ABNORMAL HIGH (ref 4.5–10.5)

## 2012-06-19 LAB — BASIC METABOLIC PANEL
BUN: 17 mg/dL (ref 6–23)
CO2: 27 mEq/L (ref 19–32)
Calcium: 9.4 mg/dL (ref 8.4–10.5)
Chloride: 102 mEq/L (ref 96–112)
Creatinine, Ser: 1.3 mg/dL (ref 0.4–1.5)
GFR: 61.84 mL/min (ref >60.00)
Glucose, Bld: 78 mg/dL (ref 70–99)
Potassium: 4.5 mEq/L (ref 3.5–5.1)
Sodium: 137 mEq/L (ref 135–145)

## 2012-06-19 LAB — LDL CHOLESTEROL, DIRECT: Direct LDL: 162.3 mg/dL

## 2012-06-19 LAB — TSH: TSH: 42.35 u[IU]/mL — ABNORMAL HIGH (ref 0.35–5.50)

## 2012-06-26 ENCOUNTER — Other Ambulatory Visit (INDEPENDENT_AMBULATORY_CARE_PROVIDER_SITE_OTHER): Payer: BC Managed Care – PPO

## 2012-06-26 DIAGNOSIS — R7989 Other specified abnormal findings of blood chemistry: Secondary | ICD-10-CM

## 2012-06-26 DIAGNOSIS — R946 Abnormal results of thyroid function studies: Secondary | ICD-10-CM

## 2012-06-26 LAB — TSH: TSH: 38.49 u[IU]/mL — ABNORMAL HIGH (ref 0.35–5.50)

## 2012-06-26 LAB — T4, FREE: Free T4: 0.31 ng/dL — ABNORMAL LOW (ref 0.60–1.60)

## 2012-06-27 ENCOUNTER — Encounter: Payer: Self-pay | Admitting: Internal Medicine

## 2012-06-28 ENCOUNTER — Telehealth: Payer: Self-pay | Admitting: *Deleted

## 2012-06-28 ENCOUNTER — Encounter: Payer: Self-pay | Admitting: *Deleted

## 2012-06-28 MED ORDER — LEVOTHYROXINE SODIUM 100 MCG PO TABS: ORAL_TABLET | ORAL | Status: DC

## 2012-06-28 NOTE — Telephone Encounter (Signed)
Spoke with patient and advised results rx sent to pharmacy by e-script  

## 2012-06-28 NOTE — Telephone Encounter (Signed)
Message copied by Sueanne Margarita on Thu Jun 28, 2012  9:35 AM ------      Message from: Tillman Abide I      Created: Wed Jun 27, 2012  1:59 PM       Please call him       The repeat tests confirm an underactive thyroid      Please have him start levothyroxine 0.1mg -----  1/2 tab for 2 weeks, then 1 tab daily      #1 year Rx            Set up free T4 and TSH in about 2 months (244.9) ------

## 2012-08-31 ENCOUNTER — Other Ambulatory Visit (INDEPENDENT_AMBULATORY_CARE_PROVIDER_SITE_OTHER): Payer: BC Managed Care – PPO

## 2012-08-31 DIAGNOSIS — E039 Hypothyroidism, unspecified: Secondary | ICD-10-CM

## 2012-08-31 LAB — T4, FREE: Free T4: 1.11 ng/dL (ref 0.60–1.60)

## 2012-08-31 LAB — TSH: TSH: 1.89 u[IU]/mL (ref 0.35–5.50)

## 2012-09-03 ENCOUNTER — Encounter: Payer: Self-pay | Admitting: *Deleted

## 2012-10-22 ENCOUNTER — Other Ambulatory Visit: Payer: Self-pay | Admitting: Occupational Medicine

## 2012-10-22 ENCOUNTER — Ambulatory Visit: Payer: Self-pay

## 2012-10-22 DIAGNOSIS — R52 Pain, unspecified: Secondary | ICD-10-CM

## 2013-04-18 ENCOUNTER — Encounter: Payer: Self-pay | Admitting: *Deleted

## 2013-04-18 ENCOUNTER — Ambulatory Visit: Payer: Self-pay | Admitting: Internal Medicine

## 2013-04-18 ENCOUNTER — Encounter: Payer: Self-pay | Admitting: Internal Medicine

## 2013-04-18 ENCOUNTER — Ambulatory Visit (INDEPENDENT_AMBULATORY_CARE_PROVIDER_SITE_OTHER): Payer: BC Managed Care – PPO | Admitting: Internal Medicine

## 2013-04-18 VITALS — BP 108/60 | HR 98 | Temp 98.3°F | Wt 150.0 lb

## 2013-04-18 DIAGNOSIS — J069 Acute upper respiratory infection, unspecified: Secondary | ICD-10-CM

## 2013-04-18 NOTE — Progress Notes (Signed)
   Subjective:    Patient ID: Louis Evans, male    DOB: June 20, 1960, 53 y.o.   MRN: [id number]  HPI Everyone in family is sick Started 3-4 days ago Flu like symptoms--cold and hot Rallied a little yesterday---then started with terrible cough Cough is dry No clear cut fever  Some headache Throat is dry No ear pain No sig nasal drainage  Using night and cold day pills--up all night after one of the meds  Current Outpatient Prescriptions on File Prior to Visit  Medication Sig Dispense Refill  . levothyroxine (SYNTHROID, LEVOTHROID) 100 MCG tablet 1/2 tab for 2 weeks, then 1 tab daily  30 tablet  11   No current facility-administered medications on file prior to visit.    No Known Allergies  Past Medical History  Diagnosis Date  . History of BPH   . Tobacco use disorder   . Allergy   . Hyperlipidemia     mild  . 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 Hypothyroidism     No past surgical history on file.  Family History  Problem Relation Age of Onset  . Hypertension Mother   . Diabetes Mother   . CAD Neg Hx   . Cancer Neg Hx     prostate or colon    History   Social History  . Marital Status: Married    Spouse Name: N/A    Number of Children: 3  . Years of Education: N/A   Occupational History  .  [employer] Main Topics  . Smoking status: Former Smoker    Quit date: 09/21/2011  . Smokeless tobacco: Never Used  . Alcohol Use: Yes  . Drug Use: No  . Sexual Activity: Not on file   Other Topics Concern  . Not on file   Social History Narrative  . No narrative on file   Review of Systems Having right sciatic nerve problems---foot still numb. Since pulling transmission off a table No vomiting Some loose stools Appetite poor but still eating    Objective:   Physical Exam  Constitutional: He appears well-developed and well-nourished. No distress.  HENT:  No sinus tenderness TMs  normal Moderate nasal inflammation Slight pharyngeal injection--no exudates  Neck: Normal range of motion. Neck supple.  Pulmonary/Chest: Effort normal and breath sounds normal. No respiratory distress. He has no wheezes. He has no rales.  Lymphadenopathy:    He has no cervical adenopathy.          Assessment & Plan:

## 2013-04-18 NOTE — Assessment & Plan Note (Signed)
Seems to be self limited viral infection Up all night with decongestant--discussed Discussed supportive care--analgesics Consider antibiotic next week if symptoms of secondary bacterial infection

## 2013-04-18 NOTE — Progress Notes (Signed)
Pre visit review using our clinic review tool, if applicable. No additional management support is needed unless otherwise documented below in the visit note. 

## 2013-04-18 NOTE — Patient Instructions (Signed)

## 2013-06-10 ENCOUNTER — Other Ambulatory Visit: Payer: Self-pay | Admitting: Internal Medicine

## 2013-07-19 ENCOUNTER — Other Ambulatory Visit: Payer: Self-pay | Admitting: Internal Medicine

## 2013-09-10 ENCOUNTER — Other Ambulatory Visit: Payer: Self-pay | Admitting: Internal Medicine

## 2013-10-12 ENCOUNTER — Other Ambulatory Visit: Payer: Self-pay | Admitting: Internal Medicine

## 2013-10-16 ENCOUNTER — Encounter: Payer: Self-pay | Admitting: Internal Medicine

## 2013-10-16 ENCOUNTER — Ambulatory Visit (INDEPENDENT_AMBULATORY_CARE_PROVIDER_SITE_OTHER): Payer: BC Managed Care – PPO | Admitting: Internal Medicine

## 2013-10-16 VITALS — BP 128/78 | HR 67 | Temp 98.6°F | Resp 14 | Ht 67.25 in | Wt 144.2 lb

## 2013-10-16 DIAGNOSIS — N138 Other obstructive and reflux uropathy: Secondary | ICD-10-CM | POA: Insufficient documentation

## 2013-10-16 DIAGNOSIS — N400 Enlarged prostate without lower urinary tract symptoms: Secondary | ICD-10-CM

## 2013-10-16 DIAGNOSIS — E039 Hypothyroidism, unspecified: Secondary | ICD-10-CM | POA: Insufficient documentation

## 2013-10-16 DIAGNOSIS — E785 Hyperlipidemia, unspecified: Secondary | ICD-10-CM

## 2013-10-16 DIAGNOSIS — Z23 Encounter for immunization: Secondary | ICD-10-CM

## 2013-10-16 DIAGNOSIS — N401 Enlarged prostate with lower urinary tract symptoms: Secondary | ICD-10-CM | POA: Insufficient documentation

## 2013-10-16 DIAGNOSIS — E038 Other specified hypothyroidism: Secondary | ICD-10-CM

## 2013-10-16 DIAGNOSIS — Z0000 Encounter for general adult medical examination without abnormal findings: Secondary | ICD-10-CM

## 2013-10-16 LAB — CBC WITH DIFFERENTIAL/PLATELET
Basophils Absolute: 0 10*3/uL (ref 0.0–0.1)
Basophils Relative: 0.7 % (ref 0.0–3.0)
Eosinophils Absolute: 0.1 10*3/uL (ref 0.0–0.7)
Eosinophils Relative: 1.2 % (ref 0.0–5.0)
HCT: 41 % (ref 39.0–52.0)
Hemoglobin: 13.4 g/dL (ref 13.0–17.0)
Lymphocytes Relative: 29.3 % (ref 12.0–46.0)
Lymphs Abs: 2.1 10*3/uL (ref 0.7–4.0)
MCHC: 32.6 g/dL (ref 30.0–36.0)
MCV: 78.8 fl (ref 78.0–100.0)
Monocytes Absolute: 0.6 10*3/uL (ref 0.1–1.0)
Monocytes Relative: 8.8 % (ref 3.0–12.0)
Neutro Abs: 4.3 10*3/uL (ref 1.4–7.7)
Neutrophils Relative %: 60 % (ref 43.0–77.0)
Platelets: 300 10*3/uL (ref 150.0–400.0)
RBC: 5.2 Mil/uL (ref 4.22–5.81)
RDW: 15.1 % (ref 11.5–15.5)
WBC: 7.2 10*3/uL (ref 4.0–10.5)

## 2013-10-16 LAB — COMPREHENSIVE METABOLIC PANEL
ALT: 21 U/L (ref 0–53)
AST: 26 U/L (ref 0–37)
Albumin: 4.2 g/dL (ref 3.5–5.2)
Alkaline Phosphatase: 49 U/L (ref 39–117)
BUN: 11 mg/dL (ref 6–23)
CO2: 27 mEq/L (ref 19–32)
Calcium: 9.5 mg/dL (ref 8.4–10.5)
Chloride: 105 mEq/L (ref 96–112)
Creatinine, Ser: 1.4 mg/dL (ref 0.4–1.5)
GFR: 58.39 mL/min — ABNORMAL LOW (ref >60.00)
Glucose, Bld: 94 mg/dL (ref 70–99)
Potassium: 4.4 mEq/L (ref 3.5–5.1)
Sodium: 139 mEq/L (ref 135–145)
Total Bilirubin: 2 mg/dL — ABNORMAL HIGH (ref 0.2–1.2)
Total Protein: 7.6 g/dL (ref 6.0–8.3)

## 2013-10-16 LAB — LIPID PANEL
Cholesterol: 233 mg/dL — ABNORMAL HIGH (ref 0–200)
HDL: 69.2 mg/dL (ref >39.00)
LDL Cholesterol: 154 mg/dL — ABNORMAL HIGH (ref 0–99)
NonHDL: 163.8
Total CHOL/HDL Ratio: 3
Triglycerides: 48 mg/dL (ref 0.0–149.0)
VLDL: 9.6 mg/dL (ref 0.0–40.0)

## 2013-10-16 LAB — T4, FREE: Free T4: 1.03 ng/dL (ref 0.60–1.60)

## 2013-10-16 LAB — TSH: TSH: 2.1 u[IU]/mL (ref 0.35–4.50)

## 2013-10-16 MED ORDER — LEVOTHYROXINE SODIUM 100 MCG PO TABS: 100.0000 ug | ORAL_TABLET | Freq: Every day | ORAL | Status: DC

## 2013-10-16 NOTE — Assessment & Plan Note (Signed)
Healthy but very stressed out. He is looking for another job. I really don't recommend meds---sounds like temper, getting upset. Discussed walking or other exercise for stress relief Flu shot UTD on colon Defer PSA to next year at least

## 2013-10-16 NOTE — Assessment & Plan Note (Signed)
Discussed primary prevention  Will hold off on meds

## 2013-10-16 NOTE — Progress Notes (Signed)
Pre visit review using our clinic review tool, if applicable. No additional management support is needed unless otherwise documented below in the visit note. 

## 2013-10-16 NOTE — Progress Notes (Signed)
Subjective:    Patient ID: Louis Evans, male    DOB: Nov 19, 1960, 53 y.o.   MRN: [id number]  HPI Here for physical  "I feel like an 53 year old man" Pain in both shoulders and arms--starts in the neck Left side is mostly in flexor arm--- felt something pop and has a lump there (points towards upper triceps) Trouble lifting now with left arm Ibuprofen 600-1200mg  didn't really help. Aleve also didn't help. Hasn't tried heat  Now working as Dealer in [employer] No guaranteed income Hates his job--- gets upset daily  Same problems voiding Slow stream--has to force it. Will stop (early in AM--then better during the day) Nocturia 0-2 and stable  Current Outpatient Prescriptions on File Prior to Visit  Medication Sig Dispense Refill  . levothyroxine (SYNTHROID, LEVOTHROID) 100 MCG tablet TAKE 1 TABLET (100 MCG TOTAL) BY MOUTH DAILY BEFORE BREAKFAST.  30 tablet  0   No current facility-administered medications on file prior to visit.    No Known Allergies  Past Medical History  Diagnosis Date  . History of BPH   . Tobacco use disorder   . Allergy   . Hyperlipidemia     mild  . 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 Hypothyroidism     No past surgical history on file.  Family History  Problem Relation Age of Onset  . Hypertension Mother   . Diabetes Mother   . CAD Neg Hx   . Cancer Neg Hx     prostate or colon    History   Social History  . Marital Status: Married    Spouse Name: N/A    Number of Children: 3  . Years of Education: N/A   Occupational History  . Mechanic     [employer]   Social History Main Topics  . Smoking status: Former Smoker    Quit date: 09/21/2011  . Smokeless tobacco: Never Used  . Alcohol Use: Yes  . Drug Use: No  . Sexual Activity: Not on file   Other Topics Concern  . Not on file   Social History Narrative  . No narrative on file   Review of Systems  Constitutional: Negative  for fatigue and unexpected weight change.       Wears seat belt  HENT: Negative for hearing loss and tinnitus.        Discussed hearing protection Regular with dentist for his remaining lower teeth  Eyes: Negative for visual disturbance.       No diplopia or unilateral vision loss  Respiratory: Negative for cough, chest tightness and shortness of breath.   Cardiovascular: Negative for chest pain, palpitations and leg swelling.  Gastrointestinal: Negative for nausea, vomiting, abdominal pain, constipation and blood in stool.       Heartburn with "cheap sauce" ---rolaids helps  Endocrine: Negative for polydipsia and polyuria.  Genitourinary: Positive for urgency, frequency and difficulty urinating.       No sexual problems  Musculoskeletal: Positive for arthralgias and back pain. Negative for joint swelling.  Skin: Negative for rash.       No suspicious lesions  Allergic/Immunologic: Negative for environmental allergies and immunocompromised state.       Slight stuffiness  Neurological: Positive for numbness. Negative for dizziness, syncope, weakness, light-headedness and headaches.       Right foot numbness since sciatic spell (may be due to wallet in back pocket)  Hematological: Negative for adenopathy. Does  not bruise/bleed easily.  Psychiatric/Behavioral: Negative for sleep disturbance and dysphoric mood. The patient is not nervous/anxious.        Stress with his job and money Will "flip out" easy--relates to stress. Looking for another job       Objective:   Physical Exam  Constitutional: He is oriented to person, place, and time. He appears well-developed and well-nourished. No distress.  HENT:  Head: Normocephalic and atraumatic.  Right Ear: External ear normal.  Left Ear: External ear normal.  Mouth/Throat: Oropharynx is clear and moist. No oropharyngeal exudate.  Eyes: Conjunctivae and EOM are normal. Pupils are equal, round, and reactive to light.  Neck: Normal range of  motion. Neck supple. No thyromegaly present.  Cardiovascular: Normal rate, regular rhythm, normal heart sounds and intact distal pulses.  Exam reveals no gallop.   No murmur heard. Pulmonary/Chest: Effort normal and breath sounds normal. No respiratory distress. He has no wheezes. He has no rales.  Abdominal: Soft. There is no tenderness.  Musculoskeletal: He exhibits no edema and no tenderness.  Small lump along triceps (small tear?) Tightness along traps  Lymphadenopathy:    He has no cervical adenopathy.  Neurological: He is alert and oriented to person, place, and time.  Normal strength in arms  Skin: No rash noted. No erythema.  Psychiatric: He has a normal mood and affect. His behavior is normal.          Assessment & Plan:

## 2013-10-16 NOTE — Assessment & Plan Note (Signed)
Mild symptoms No meds needed

## 2013-10-16 NOTE — Assessment & Plan Note (Addendum)
Clinically euthyroid Will recheck labs

## 2014-03-07 ENCOUNTER — Encounter: Payer: Self-pay | Admitting: Internal Medicine

## 2014-08-21 ENCOUNTER — Encounter: Payer: Self-pay | Admitting: Primary Care

## 2014-08-21 ENCOUNTER — Telehealth: Payer: Self-pay | Admitting: Internal Medicine

## 2014-08-21 ENCOUNTER — Ambulatory Visit (INDEPENDENT_AMBULATORY_CARE_PROVIDER_SITE_OTHER): Payer: Self-pay | Admitting: Primary Care

## 2014-08-21 VITALS — BP 132/84 | HR 65 | Temp 98.4°F | Ht 67.25 in | Wt 154.8 lb

## 2014-08-21 DIAGNOSIS — R339 Retention of urine, unspecified: Secondary | ICD-10-CM

## 2014-08-21 LAB — POCT URINALYSIS DIPSTICK
Bilirubin, UA: NEGATIVE
Glucose, UA: NEGATIVE
Nitrite, UA: POSITIVE
Spec Grav, UA: 1.02
Urobilinogen, UA: NEGATIVE
pH, UA: 6

## 2014-08-21 MED ORDER — CIPROFLOXACIN HCL 500 MG PO TABS: 500.0000 mg | ORAL_TABLET | Freq: Two times a day (BID) | ORAL | Status: DC

## 2014-08-21 MED ORDER — TAMSULOSIN HCL 0.4 MG PO CAPS: 0.4000 mg | ORAL_CAPSULE | Freq: Every day | ORAL | Status: DC

## 2014-08-21 NOTE — Progress Notes (Signed)
   Subjective:    Patient ID: Louis Evans, male    DOB: 06-Dec-1960, 54 y.o.   MRN: [id number]  HPI  Mr. Kotowski is a 54 year old male who presents today with a chief complaint of urinary retention. His symptoms began yesterday body aches, dysuria, chills. He has the urge to urinate but cannot establish a stream as he will dribble urine. He has a history of enlarged prostate and reports these symptoms are similar to his prior episode of prostatitis several years ago. He's been drinking water but has not taken anything OTC for symptoms.   Review of Systems  Constitutional: Positive for chills. Negative for fever.  Respiratory: Negative for shortness of breath.   Cardiovascular: Negative for chest pain.  Genitourinary: Positive for dysuria, frequency and difficulty urinating.  Musculoskeletal: Positive for myalgias.       Past Medical History  Diagnosis Date  . History of BPH   . Tobacco use disorder   . Allergy   . Hyperlipidemia     mild  . 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 Hypothyroidism   . BPH (benign prostatic hypertrophy)     History   Social History  . Marital Status: Married    Spouse Name: N/A  . Number of Children: 3  . Years of Education: N/A   Occupational History  . Mechanic     [employer]   Social History Main Topics  . Smoking status: Former Smoker    Quit date: 09/21/2011  . Smokeless tobacco: Never Used  . Alcohol Use: Yes  . Drug Use: No  . Sexual Activity: Not on file   Other Topics Concern  . Not on file   Social History Narrative    No past surgical history on file.  Family History  Problem Relation Age of Onset  . Hypertension Mother   . Diabetes Mother   . CAD Neg Hx   . Cancer Neg Hx     prostate or colon    No Known Allergies  Current Outpatient Prescriptions on File Prior to Visit  Medication Sig Dispense Refill  . levothyroxine (SYNTHROID, LEVOTHROID) 100 MCG tablet Take 1  tablet (100 mcg total) by mouth daily before breakfast. 30 tablet 11   No current facility-administered medications on file prior to visit.    BP 132/84 mmHg  Pulse 65  Temp(Src) 98.4 F (36.9 C) (Oral)  Ht 5' 7.25" (1.708 m)  Wt 154 lb 12.8 oz (70.217 kg)  BMI 24.07 kg/m2  SpO2 96%    Objective:   Physical Exam  Constitutional:  Appears very uncomfortable.  Cardiovascular: Normal rate and regular rhythm.   Pulmonary/Chest: Effort normal and breath sounds normal.  Abdominal: Soft. Bowel sounds are normal. There is no tenderness.  Skin: Skin is warm and dry.          Assessment & Plan:  Acute prostatitis:  Suspect bacterial involvement due to systemic symptoms. He is very uncomfortable during exam. Little to no urine flow. UA: Positive for leuks, nitrites, protein, blood, ketones. Culture sent. Treat with Cipro BID x 14 days. RX for Flomax to help with urine flow. Follow up in 2 weeks if no improvement.

## 2014-08-21 NOTE — Telephone Encounter (Signed)
[facility] Patient Name: LYDEN REDNER DOB: 1960-05-02 Initial Comment Caller states husband has UTI symptoms - urinary frequency with little results Nurse Assessment Nurse: Mechele Dawley, RN, Amy Date/Time (Eastern Time): 08/21/2014 9:20:05 AM Confirm and document reason for call. If symptomatic, describe symptoms. ---WIFE STATES THAT HE HAD A FEVER YESTERDAY. WE WOKE UP AFTER MIDNIGHT AND ODOR WITH URINE WAS WEIRD AND THEN DRIBBLING WITH URINE. HAVING A LOT OF FREQUENCY. Has the patient traveled out of the country within the last 30 days? ---Not Applicable Does the patient require triage? ---Yes Related visit to physician within the last 2 weeks? ---No Does the PT have any chronic conditions? (i.e. diabetes, asthma, etc.) ---Yes List chronic conditions. ---THYROID Guidelines Guideline Title Affirmed Question Affirmed Notes Urination Pain - Male [1] SEVERE pain with urination (e.g., excruciating) AND [2] not improved after 2 hours of pain medicine (e.g., acetaminophen or ibuprofen) Final Disposition User See Physician within 4 Hours (or PCP triage) Mechele Dawley, RN, Amy Referrals REFERRED TO PCP OFFICE Disagree/Comply: Comply

## 2014-08-21 NOTE — Patient Instructions (Signed)
Start Ciprofloxacin antibiotics daily for acute prostatitis. Take 1 tablet by mouth twice daily for 14 days.  Start Flomax capsules for urinary retention. Take 1 capsule by mouth daily.  Please call me if you continue to experience symptoms after completion of antibiotics.  It was a pleasure meeting you!  Prostatitis The prostate gland is about the size and shape of a walnut. It is located just below your bladder. It produces one of the components of semen, which is made up of sperm and the fluids that help nourish and transport it out from the testicles. Prostatitis is inflammation of the prostate gland.  There are four types of prostatitis:  Acute bacterial prostatitis. This is the least common type of prostatitis. It starts quickly and usually is associated with a bladder infection, high fever, and shaking chills. It can occur at any age.  Chronic bacterial prostatitis. This is a persistent bacterial infection in the prostate. It usually develops from repeated acute bacterial prostatitis or acute bacterial prostatitis that was not properly treated. It can occur in men of any age but is most common in middle-aged men whose prostate has begun to enlarge. The symptoms are not as severe as those in acute bacterial prostatitis. Discomfort in the part of your body that is in front of your rectum and below your scrotum (perineum), lower abdomen, or in the head of your penis (glans) may represent your primary discomfort.  Chronic prostatitis (nonbacterial). This is the most common type of prostatitis. It is inflammation of the prostate gland that is not caused by a bacterial infection. The cause is unknown and may be associated with a viral infection or autoimmune disorder.  Prostatodynia (pelvic floor disorder). This is associated with increased muscular tone in the pelvis surrounding the prostate. CAUSES The causes of bacterial prostatitis are bacterial infection. The causes of the other types of  prostatitis are unknown.  SYMPTOMS  Symptoms can vary depending upon the type of prostatitis that exists. There can also be overlap in symptoms. Possible symptoms for each type of prostatitis are listed below. Acute Bacterial Prostatitis  Painful urination.  Fever or chills.  Muscle or joint pains.  Low back pain.  Low abdominal pain.  Inability to empty bladder completely. Chronic Bacterial Prostatitis, Chronic Nonbacterial Prostatitis, and Prostatodynia  Sudden urge to urinate.  Frequent urination.  Difficulty starting urine stream.  Weak urine stream.  Discharge from the urethra.  Dribbling after urination.  Rectal pain.  Pain in the testicles, penis, or tip of the penis.  Pain in the perineum.  Problems with sexual function.  Painful ejaculation.  Bloody semen. DIAGNOSIS  In order to diagnose prostatitis, your health care provider will ask about your symptoms. One or more urine samples will be taken and tested (urinalysis). If the urinalysis result is negative for bacteria, your health care provider may use a finger to feel your prostate (digital rectal exam). This exam helps your health care provider determine if your prostate is swollen and tender. It will also produce a specimen of semen that can be analyzed. TREATMENT  Treatment for prostatitis depends on the cause. If a bacterial infection is the cause, it can be treated with antibiotic medicine. In cases of chronic bacterial prostatitis, the use of antibiotics for up to 1 month or 6 weeks may be necessary. Your health care provider may instruct you to take sitz baths to help relieve pain. A sitz bath is a bath of hot water in which your hips and buttocks are  under water. This relaxes the pelvic floor muscles and often helps to relieve the pressure on your prostate. HOME CARE INSTRUCTIONS   Take all medicines as directed by your health care provider.  Take sitz baths as directed by your health care  provider. SEEK MEDICAL CARE IF:   Your symptoms get worse, not better.  You have a fever. SEEK IMMEDIATE MEDICAL CARE IF:   You have chills.  You feel nauseous or vomit.  You feel lightheaded or faint.  You are unable to urinate.  You have blood or blood clots in your urine. MAKE SURE YOU:  Understand these instructions.  Will watch your condition.  Will get help right away if you are not doing well or get worse. Document Released: 01/01/2000 Document Revised: 01/08/2013 Document Reviewed: 07/23/2012 [facility] Patient Information 2015 [place], [place]. This information is not intended to replace advice given to you by your health care provider. Make sure you discuss any questions you have with your health care provider.

## 2014-08-21 NOTE — Progress Notes (Signed)
Pre visit review using our clinic review tool, if applicable. No additional management support is needed unless otherwise documented below in the visit note. 

## 2014-08-21 NOTE — Telephone Encounter (Signed)
Pt has appt 08/18/14 at 1 pm with Allie Bossier NP.

## 2014-08-24 LAB — URINE CULTURE: Colony Count: >=100000

## 2014-09-16 ENCOUNTER — Other Ambulatory Visit: Payer: Self-pay | Admitting: Primary Care

## 2014-09-16 NOTE — Telephone Encounter (Signed)
Approved: okay to fill for a year Has PE coming up

## 2014-09-16 NOTE — Telephone Encounter (Signed)
rx sent to pharmacy by e-script  

## 2014-09-16 NOTE — Telephone Encounter (Signed)
Ok to fill 

## 2014-10-14 ENCOUNTER — Other Ambulatory Visit: Payer: Self-pay | Admitting: Internal Medicine

## 2014-10-21 ENCOUNTER — Encounter: Payer: Self-pay | Admitting: Internal Medicine

## 2014-11-25 ENCOUNTER — Other Ambulatory Visit: Payer: Self-pay | Admitting: Internal Medicine

## 2014-11-25 NOTE — Telephone Encounter (Signed)
Pt has canceled or missed all of his CPX appts, last seen 09/2013 and no future appts scheduled. Ok to refill?

## 2014-11-25 NOTE — Telephone Encounter (Signed)
Give him 2 months worth and tell him I need to see him before I can give more

## 2014-11-25 NOTE — Telephone Encounter (Signed)
rx sent to pharmacy by e-script  

## 2014-12-29 ENCOUNTER — Other Ambulatory Visit: Payer: Self-pay | Admitting: *Deleted

## 2014-12-29 NOTE — Telephone Encounter (Signed)
Refill denied until patient makes appt .left message to have patient return my call.

## 2014-12-29 NOTE — Telephone Encounter (Signed)
Received refill request electronically Last office visit 10/06/13 Last refill patient was to schedule an appointment, no upcoming appointment scheduled Is it okay to refill

## 2015-02-21 ENCOUNTER — Other Ambulatory Visit: Payer: Self-pay | Admitting: Internal Medicine

## 2015-02-23 NOTE — Telephone Encounter (Signed)
.  left message to have patient return my call. Refill denied pt needs to make appt, last seen 10/16/2013, last refill 11/25/2014

## 2015-03-11 ENCOUNTER — Other Ambulatory Visit: Payer: Self-pay | Admitting: Internal Medicine

## 2015-03-12 ENCOUNTER — Other Ambulatory Visit: Payer: Self-pay

## 2015-03-12 MED ORDER — LEVOTHYROXINE SODIUM 100 MCG PO TABS: ORAL_TABLET | ORAL | Status: DC

## 2015-03-12 NOTE — Telephone Encounter (Signed)
V/M left requesting refill levothyroxine; last refilled # 30 x 2 on 11/25/2014 with note needs appt before further refills. Last annual 10/16/13 and no future appts scheduled. [facility] [place].

## 2015-03-12 NOTE — Telephone Encounter (Signed)
Spoke with patient and wife and they do not have medical insurance at the time and can not come in for a visit, I advised wife and husband that it's been 2 years since pt has been in which she argued with me about and said he came in feb. Ok for pt to come in for OV and not physical?

## 2015-03-12 NOTE — Telephone Encounter (Signed)
Spoke with patient and advised results   

## 2015-03-12 NOTE — Telephone Encounter (Signed)
Absolutely Let them know about our reduced fee of $80 for people without insurance

## 2015-03-12 NOTE — Telephone Encounter (Signed)
Okay to refill for 3 months then--but I don't think I can do longer

## 2015-03-12 NOTE — Telephone Encounter (Signed)
Approved: #30 x 0 Let him know I don't want him to run out but he must come in in the next month!!!!

## 2015-03-12 NOTE — Telephone Encounter (Signed)
Spoke with wife and advised results, per wife they can't afford to come in and pay anything, wife states the labs will also cost.I advised we would need to know his thyroid level in order to see if the medication needs to be adjusted. I gave [facility] [place], but per wife she wants refills to last until pt finds a job and get insurance which shouldn't be long.

## 2015-06-07 ENCOUNTER — Other Ambulatory Visit: Payer: Self-pay | Admitting: Internal Medicine

## 2015-07-03 ENCOUNTER — Encounter: Payer: Self-pay | Admitting: Internal Medicine

## 2015-07-03 ENCOUNTER — Ambulatory Visit (INDEPENDENT_AMBULATORY_CARE_PROVIDER_SITE_OTHER): Payer: BLUE CROSS/BLUE SHIELD | Admitting: Internal Medicine

## 2015-07-03 VITALS — BP 118/80 | HR 62 | Temp 98.5°F | Ht 67.0 in | Wt 147.0 lb

## 2015-07-03 DIAGNOSIS — Z0000 Encounter for general adult medical examination without abnormal findings: Secondary | ICD-10-CM | POA: Diagnosis not present

## 2015-07-03 DIAGNOSIS — N400 Enlarged prostate without lower urinary tract symptoms: Secondary | ICD-10-CM | POA: Diagnosis not present

## 2015-07-03 DIAGNOSIS — E785 Hyperlipidemia, unspecified: Secondary | ICD-10-CM

## 2015-07-03 DIAGNOSIS — D126 Benign neoplasm of colon, unspecified: Secondary | ICD-10-CM | POA: Insufficient documentation

## 2015-07-03 DIAGNOSIS — Z125 Encounter for screening for malignant neoplasm of prostate: Secondary | ICD-10-CM

## 2015-07-03 DIAGNOSIS — E039 Hypothyroidism, unspecified: Secondary | ICD-10-CM

## 2015-07-03 LAB — PSA: PSA: 0.93 ng/mL (ref 0.10–4.00)

## 2015-07-03 LAB — COMPREHENSIVE METABOLIC PANEL
ALT: 22 U/L (ref 0–53)
AST: 21 U/L (ref 0–37)
Albumin: 4.2 g/dL (ref 3.5–5.2)
Alkaline Phosphatase: 61 U/L (ref 39–117)
BUN: 12 mg/dL (ref 6–23)
CO2: 27 mEq/L (ref 19–32)
Calcium: 9.9 mg/dL (ref 8.4–10.5)
Chloride: 106 mEq/L (ref 96–112)
Creatinine, Ser: 1.28 mg/dL (ref 0.40–1.50)
GFR: 61.7 mL/min (ref >60.00)
Glucose, Bld: 91 mg/dL (ref 70–99)
Potassium: 4.8 mEq/L (ref 3.5–5.1)
Sodium: 141 mEq/L (ref 135–145)
Total Bilirubin: 1.1 mg/dL (ref 0.2–1.2)
Total Protein: 6.9 g/dL (ref 6.0–8.3)

## 2015-07-03 LAB — CBC WITH DIFFERENTIAL/PLATELET
Basophils Absolute: 0.1 10*3/uL (ref 0.0–0.1)
Basophils Relative: 1.2 % (ref 0.0–3.0)
Eosinophils Absolute: 0.2 10*3/uL (ref 0.0–0.7)
Eosinophils Relative: 2.5 % (ref 0.0–5.0)
HCT: 41.8 % (ref 39.0–52.0)
Hemoglobin: 13.4 g/dL (ref 13.0–17.0)
Lymphocytes Relative: 29.4 % (ref 12.0–46.0)
Lymphs Abs: 1.9 10*3/uL (ref 0.7–4.0)
MCHC: 32 g/dL (ref 30.0–36.0)
MCV: 78.3 fl (ref 78.0–100.0)
Monocytes Absolute: 0.7 10*3/uL (ref 0.1–1.0)
Monocytes Relative: 10.3 % (ref 3.0–12.0)
Neutro Abs: 3.7 10*3/uL (ref 1.4–7.7)
Neutrophils Relative %: 56.6 % (ref 43.0–77.0)
Platelets: 319 10*3/uL (ref 150.0–400.0)
RBC: 5.35 Mil/uL (ref 4.22–5.81)
RDW: 14.9 % (ref 11.5–15.5)
WBC: 6.6 10*3/uL (ref 4.0–10.5)

## 2015-07-03 LAB — LIPID PANEL
Cholesterol: 225 mg/dL — ABNORMAL HIGH (ref 0–200)
HDL: 65.1 mg/dL (ref >39.00)
LDL Cholesterol: 149 mg/dL — ABNORMAL HIGH (ref 0–99)
NonHDL: 159.55
Total CHOL/HDL Ratio: 3
Triglycerides: 51 mg/dL (ref 0.0–149.0)
VLDL: 10.2 mg/dL (ref 0.0–40.0)

## 2015-07-03 LAB — TSH: TSH: 1.15 u[IU]/mL (ref 0.35–4.50)

## 2015-07-03 LAB — T4, FREE: Free T4: 1.07 ng/dL (ref 0.60–1.60)

## 2015-07-03 NOTE — Assessment & Plan Note (Signed)
Healthy Mild fatigue ---doesn't sound pathologic. Discussed enough sleep Will check PSA after discussion

## 2015-07-03 NOTE — Assessment & Plan Note (Signed)
Due for colon 

## 2015-07-03 NOTE — Progress Notes (Signed)
Pre visit review using our clinic review tool, if applicable. No additional management support is needed unless otherwise documented below in the visit note. 

## 2015-07-03 NOTE — Assessment & Plan Note (Signed)
Seems euthyroid Due for labs 

## 2015-07-03 NOTE — Progress Notes (Signed)
Subjective:    Patient ID: Louis Evans, male    DOB: [birth year], 55 y.o.   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 levothyroxine (SYNTHROID, LEVOTHROID) 100 MCG tablet TAKE 1 TABLET (100 MCG TOTAL) BY MOUTH DAILY BEFORE BREAKFAST. 90 tablet 0  . tamsulosin (FLOMAX) 0.4 MG CAPS capsule TAKE 1 CAPSULE (0.4 MG TOTAL) BY MOUTH DAILY. (Patient not taking: Reported on 07/03/2015) 90 capsule 3   No current facility-administered medications on file prior to visit.    No Known Allergies  Past Medical History  Diagnosis Date  . History of BPH   . Tobacco use disorder   . Allergy   . Hyperlipidemia     mild  . 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 Hypothyroidism   . BPH (benign prostatic hypertrophy)     No past surgical history on file.  Family History  Problem Relation Age of Onset  . Hypertension Mother   . Diabetes Mother   . CAD Neg Hx   . Cancer Neg Hx     prostate or colon    Social History   Social History  . Marital Status: Married    Spouse Name: N/A  . Number of Children: 3  . Years of Education: N/A   Occupational History  . Runs fork lift         Social History Main Topics  . Smoking status: Former Smoker    Quit date: 09/21/2011  . Smokeless tobacco: Never Used  . Alcohol Use: Yes  . Drug Use: No  . Sexual Activity: Not on file   Other Topics Concern  . Not on file   Social History Narrative   Review of Systems  Constitutional: Positive for fatigue. Negative for unexpected weight change.       Wears  seat belt Doesn't exercise --- discussed  HENT: Positive for hearing loss and tinnitus. Negative for dental problem.        Keeps up with dentist [place]  Eyes: Negative for visual disturbance.       No diplopia or unilateral vision loss  Respiratory: Negative for cough, chest tightness and shortness of breath.   Cardiovascular: Negative for chest pain, palpitations and leg swelling.  Gastrointestinal: Negative for nausea, vomiting, abdominal pain, constipation and blood in stool.  Endocrine: Negative for polydipsia and polyuria.  Genitourinary: Positive for frequency and difficulty urinating.       No sexual problems  Musculoskeletal: Positive for back pain. Negative for joint swelling and arthralgias.  Skin: Negative for rash.       No suspicious lesions  Allergic/Immunologic: Positive for environmental allergies. Negative for immunocompromised state.       Uses meds prn  Neurological: Negative for dizziness, syncope, weakness, light-headedness and headaches.  Hematological: Negative for adenopathy. Does not bruise/bleed easily.  Psychiatric/Behavioral: Negative for sleep disturbance and dysphoric mood. The patient is not nervous/anxious.        Objective:   Physical Exam  Constitutional: He is oriented to person, place, and  time. He appears well-developed and well-nourished. No distress.  HENT:  Head: Normocephalic and atraumatic.  Right Ear: External ear normal.  Left Ear: External ear normal.  Mouth/Throat: Oropharynx is clear and moist. No oropharyngeal exudate.  Eyes: Conjunctivae are normal. Pupils are equal, round, and reactive to light.  Neck: Normal range of motion. Neck supple. No thyromegaly present.  Cardiovascular: Normal rate, regular rhythm, normal heart sounds and intact distal pulses.  Exam reveals no gallop.   No murmur heard. Pulmonary/Chest: Effort normal and breath sounds normal. No respiratory distress. He has no wheezes. He has no rales.  Abdominal:  Soft. There is no tenderness.  Musculoskeletal: He exhibits no edema or tenderness.  Lymphadenopathy:    He has no cervical adenopathy.  Neurological: He is alert and oriented to person, place, and time.  Skin: No rash noted. No erythema.  Psychiatric: He has a normal mood and affect. His behavior is normal.          Assessment & Plan:

## 2015-07-03 NOTE — Addendum Note (Signed)
Addended by: Marchia Bond on: 07/03/2015 09:56 AM   Modules accepted: Miquel Dunn

## 2015-07-03 NOTE — Assessment & Plan Note (Signed)
High HDL No meds

## 2015-07-03 NOTE — Assessment & Plan Note (Signed)
Uses the flomax prn

## 2015-09-06 ENCOUNTER — Other Ambulatory Visit: Payer: Self-pay | Admitting: Internal Medicine

## 2015-10-06 ENCOUNTER — Telehealth: Payer: Self-pay | Admitting: Gastroenterology

## 2015-10-06 NOTE — Telephone Encounter (Signed)
We had received colon report on patient. Patient has seen Dr. Eugenia Pancoast before. I have left several message for patient to return my call. We need to know why patient is wanting to come back to our office. Records will be in "records reviewed" folder.

## 2015-10-09 ENCOUNTER — Telehealth: Payer: Self-pay | Admitting: Gastroenterology

## 2015-10-09 NOTE — Telephone Encounter (Signed)
Received last colon report from [facility].  Pt is wanting to transfer back to [facility] because of Insurance reasons.  Would you accept the switch?

## 2015-10-14 ENCOUNTER — Encounter: Payer: Self-pay | Admitting: Gastroenterology

## 2015-10-23 ENCOUNTER — Encounter: Payer: Self-pay | Admitting: Internal Medicine

## 2015-11-24 ENCOUNTER — Ambulatory Visit (AMBULATORY_SURGERY_CENTER): Payer: Self-pay

## 2015-11-24 VITALS — Ht 68.0 in | Wt 146.6 lb

## 2015-11-24 DIAGNOSIS — Z86010 Personal history of colonic polyps: Secondary | ICD-10-CM

## 2015-11-24 MED ORDER — NA SULFATE-K SULFATE-MG SULF 17.5-3.13-1.6 GM/177ML PO SOLN: ORAL | 0 refills | Status: DC

## 2015-11-24 NOTE — Progress Notes (Signed)
Per pt, no allergies to soy or egg products.Pt not taking any weight loss meds or using  O2 at home. 

## 2015-11-27 ENCOUNTER — Encounter: Payer: Self-pay | Admitting: Gastroenterology

## 2015-12-07 ENCOUNTER — Encounter: Payer: Self-pay | Admitting: Gastroenterology

## 2015-12-15 ENCOUNTER — Other Ambulatory Visit: Payer: Self-pay

## 2015-12-15 NOTE — Telephone Encounter (Signed)
[facility] [place] and [facility] another prescription for Suprep ,per wife's request. Spoke with pt to notify that the [place] was called into the pharmacy and he was taking a double prep which he needs to pick up the Miralax and Dulcolax in the laxative department.also. Pt understood. He will call back if he has further questions. Gwyndolyn Saxon

## 2015-12-18 ENCOUNTER — Telehealth: Payer: Self-pay | Admitting: Gastroenterology

## 2015-12-18 NOTE — Telephone Encounter (Signed)
I called wife and provided her a coupon to pick up for the prep.  Marland Kitchen She will come pick up today or next week

## 2015-12-22 ENCOUNTER — Ambulatory Visit (AMBULATORY_SURGERY_CENTER): Payer: BLUE CROSS/BLUE SHIELD | Admitting: Gastroenterology

## 2015-12-22 ENCOUNTER — Encounter: Payer: Self-pay | Admitting: Gastroenterology

## 2015-12-22 VITALS — BP 94/59 | HR 71 | Temp 97.7°F | Resp 18 | Ht 68.0 in | Wt 146.0 lb

## 2015-12-22 DIAGNOSIS — D126 Benign neoplasm of colon, unspecified: Secondary | ICD-10-CM

## 2015-12-22 DIAGNOSIS — D122 Benign neoplasm of ascending colon: Secondary | ICD-10-CM | POA: Diagnosis not present

## 2015-12-22 DIAGNOSIS — Z86010 Personal history of colonic polyps: Secondary | ICD-10-CM | POA: Diagnosis present

## 2015-12-22 DIAGNOSIS — D125 Benign neoplasm of sigmoid colon: Secondary | ICD-10-CM | POA: Diagnosis not present

## 2015-12-22 DIAGNOSIS — K635 Polyp of colon: Secondary | ICD-10-CM | POA: Diagnosis not present

## 2015-12-22 MED ORDER — SODIUM CHLORIDE 0.9 % IV SOLN: 500.0000 mL | INTRAVENOUS | Status: DC

## 2015-12-22 NOTE — Progress Notes (Signed)
Report to PACU, RN, vss, BBS= Clear.  

## 2015-12-22 NOTE — Progress Notes (Signed)
Pt and his wife state that he had a really bad reaction to the suprep.  Last night he experienced nausea and almost passed out.

## 2015-12-22 NOTE — Patient Instructions (Signed)
3 polyps removed and sent to pathology.  Await letter for final recommendation.     YOU HAD AN ENDOSCOPIC PROCEDURE TODAY AT [facility]:   Refer to the procedure report that was given to you for any specific questions about what was found during the examination.  If the procedure report does not answer your questions, please call your gastroenterologist to clarify.  If you requested that your care partner not be given the details of your procedure findings, then the procedure report has been included in a sealed envelope for you to review at your convenience later.  YOU SHOULD EXPECT: Some feelings of bloating in the abdomen. Passage of more gas than usual.  Walking can help get rid of the air that was put into your GI tract during the procedure and reduce the bloating. If you had a lower endoscopy (such as a colonoscopy or flexible sigmoidoscopy) you may notice spotting of blood in your stool or on the toilet paper. If you underwent a bowel prep for your procedure, you may not have a normal bowel movement for a few days.  Please Note:  You might notice some irritation and congestion in your nose or some drainage.  This is from the oxygen used during your procedure.  There is no need for concern and it should clear up in a day or so.  SYMPTOMS TO REPORT IMMEDIATELY:   Following lower endoscopy (colonoscopy or flexible sigmoidoscopy):  Excessive amounts of blood in the stool  Significant tenderness or worsening of abdominal pains  Swelling of the abdomen that is new, acute  Fever of 100F or higher   For urgent or emergent issues, a gastroenterologist can be reached at any hour by calling [phone].   DIET:  We do recommend a small meal at first, but then you may proceed to your regular diet.  Drink plenty of fluids but you should avoid alcoholic beverages for 24 hours.  ACTIVITY:  You should plan to take it easy for the rest of today and you should NOT DRIVE or use  heavy machinery until tomorrow (because of the sedation medicines used during the test).    FOLLOW UP: Our staff will call the number listed on your records the next business day following your procedure to check on you and address any questions or concerns that you may have regarding the information given to you following your procedure. If we do not reach you, we will leave a message.  However, if you are feeling well and you are not experiencing any problems, there is no need to return our call.  We will assume that you have returned to your regular daily activities without incident.  If any biopsies were taken you will be contacted by phone or by letter within the next 1-3 weeks.  Please call us at [phone] if you have not heard about the biopsies in 3 weeks.    SIGNATURES/CONFIDENTIALITY: You and/or your care partner have signed paperwork which will be entered into your electronic medical record.  These signatures attest to the fact that that the information above on your After Visit Summary has been reviewed and is understood.  Full responsibility of the confidentiality of this discharge information lies with you and/or your care-partner.

## 2015-12-22 NOTE — Progress Notes (Signed)
Called to room to assist during endoscopic procedure.  Patient ID and intended procedure confirmed with present staff. Received instructions for my participation in the procedure from the performing physician.  

## 2015-12-22 NOTE — Op Note (Signed)
[facility] Patient Name: Louis Evans Procedure Date: 12/22/2015 11:07 AM MRN: [id number] Endoscopist: Milus Banister , MD Age: 55 Referring MD:  Date of Birth: April 16, 1960 Gender: Male Account #: [id number] Procedure:                Colonoscopy Indications:              High risk colon cancer surveillance: Personal                            history of colonic polyps (2012 Colonoscopy Dr.                            Harrell Lark; 2 polyps, one was adenoma, he                            recommended recall colonoscopy at 6 month interval                            due to inadequate prep) Medicines:                Monitored Anesthesia Care Procedure:                Pre-Anesthesia Assessment:                           - Prior to the procedure, a History and Physical                            was performed, and patient medications and                            allergies were reviewed. The patient's tolerance of                            previous anesthesia was also reviewed. The risks                            and benefits of the procedure and the sedation                            options and risks were discussed with the patient.                            All questions were answered, and informed consent                            was obtained. Prior Anticoagulants: The patient has                            taken no previous anticoagulant or antiplatelet                            agents. ASA Grade Assessment: II - A patient with  mild systemic disease. After reviewing the risks                            and benefits, the patient was deemed in                            satisfactory condition to undergo the procedure.                           After obtaining informed consent, the colonoscope                            was passed under direct vision. Throughout the                            procedure, the patient's blood pressure,  pulse, and                            oxygen saturations were monitored continuously. The                            Model CF-HQ190L [id number]) scope was introduced                            through the anus and advanced to the the cecum,                            identified by appendiceal orifice and ileocecal                            valve. The colonoscopy was performed without                            difficulty. The patient tolerated the procedure                            well. The quality of the bowel preparation was                            good. The ileocecal valve, appendiceal orifice, and                            rectum were photographed. Scope In: 11:11:52 AM Scope Out: 11:20:17 AM Scope Withdrawal Time: 0 hours 7 minutes 2 seconds  Total Procedure Duration: 0 hours 8 minutes 25 seconds  Findings:                 Three sessile polyps were found in the sigmoid                            colon and ascending colon. The polyps were 3 to 4                            mm in size. These polyps were removed with  a cold                            snare. Resection and retrieval were complete.                           The exam was otherwise without abnormality on                            direct and retroflexion views. Complications:            No immediate complications. Estimated blood loss:                            None. Estimated Blood Loss:     Estimated blood loss: none. Impression:               - Three 3 to 4 mm polyps in the sigmoid colon and                            in the ascending colon, removed with a cold snare.                            Resected and retrieved.                           - The examination was otherwise normal on direct                            and retroflexion views. Recommendation:           - Patient has a contact number available for                            emergencies. The signs and symptoms of potential                             delayed complications were discussed with the                            patient. Return to normal activities tomorrow.                            Written discharge instructions were provided to the                            patient.                           - Resume previous diet.                           - Continue present medications.                           You will receive a letter within 2-3 weeks with the  pathology results and my final recommendations.                           If the polyp(s) is proven to be 'pre-cancerous' on                            pathology, you will need repeat colonoscopy in 3-5                            years. If the polyp(s) is NOT 'precancerous' on                            pathology then you should repeat colon cancer                            screening in 10 years with colonoscopy without need                            for colon cancer screening by any method prior to                            then (including stool testing). Milus Banister, MD 12/22/2015 11:23:19 AM This report has been signed electronically.

## 2015-12-23 ENCOUNTER — Telehealth: Payer: Self-pay

## 2015-12-23 NOTE — Telephone Encounter (Signed)
  Follow up Call-  Call back number 12/22/2015  Post procedure Call Back phone  # [phone]  Permission to leave phone message Yes  Some recent data might be hidden     Patient questions:  Do you have a fever, pain , or abdominal swelling? No. Pain Score  0 *  Have you tolerated food without any problems? Yes.    Have you been able to return to your normal activities? Yes.    Do you have any questions about your discharge instructions: Diet   No. Medications  No. Follow up visit  No.  Do you have questions or concerns about your Care? No.  Actions: * If pain score is 4 or above: No action needed, pain <4.

## 2015-12-28 ENCOUNTER — Encounter: Payer: Self-pay | Admitting: Gastroenterology

## 2016-07-04 ENCOUNTER — Encounter: Payer: BLUE CROSS/BLUE SHIELD | Admitting: Internal Medicine

## 2016-07-08 ENCOUNTER — Encounter: Payer: Self-pay | Admitting: Internal Medicine

## 2016-08-05 ENCOUNTER — Other Ambulatory Visit: Payer: Self-pay | Admitting: Internal Medicine

## 2016-08-08 ENCOUNTER — Telehealth: Payer: Self-pay | Admitting: *Deleted

## 2016-08-08 NOTE — Telephone Encounter (Signed)
[facility] - Client >>>Contains Verbal Order - Signature Required<<< [place] Patient Name: Louis Evans Gender: Male DOB: 11-21-1960 Age: 56 Y 27 M 28 D Return Phone Number: [phone] (Primary), [phone] (Secondary) City/State/Zip: [place] Client [facility] - Client Client Site [place] - Day Physician Louis Evans - MD Who Is Calling Patient / Member / Family / Caregiver Call Type Triage / Clinical Caller Name Louis Evans Relationship To Patient Spouse Return Phone Number [phone] (Primary) Chief Complaint Prescription Refill or Medication Request (non symptomatic) Reason for Call Symptomatic / Request for [place] states her and her husband are out of town and they forgot her husband's thyroid medication. Appointment Disposition EMR Patient Refused Appointment Info pasted into Epic No Nurse Assessment Nurse: Louis Agar, RN, Louis Evans Date/Time Louis Evans Time): 08/05/2016 9:53:37 PM Confirm and document reason for call. If symptomatic, describe symptoms. ---Caller states her and her husband are out of town and they forgot her husband's thyroid medication. Please document clinical information provided and list any resource used. ---Denies any symptoms. Wil call in medication. Guidelines Guideline Title Affirmed Question Disp. Time Louis Evans Time) Disposition Final User 08/05/2016 10:03:26 PM Clinical Call Yes Louis Agar, RN, Louis Evans Verbal Orders/Maintenance Medications Medication Refill Route Dosage Regime Duration Admin Instructions User Name Levothyroxine Oral 144mcg 30 Days Take one tablet by mouth daily. Louis Agar, RN, Louis Evans PLEASE NOTE: All timestamps contained within this report are represented as [place] Standard Time. CONFIDENTIALTY NOTICE: This fax transmission is intended only for the addressee. It  contains information that is legally privileged, confidential or otherwise protected from use or disclosure. If you are not the intended recipient, you are strictly prohibited from reviewing, disclosing, copying using or disseminating any of this information or taking any action in reliance on or regarding this information. If you have received this fax in error, please notify us immediately by telephone so that we can arrange for its return to [place]. Phone: [phone], Toll-Free: [phone], Fax: [phone] Page: 2 of 2 Call Id: [phone] [place] >>>Contains Verbal Order - Signature Required<<< [address], [place] [address] [phone] Fax: [phone] MEDICATION ORDER  [facility] - Day Date: 08/05/2016 From: [facility] Department To: Louis Evans - MD Please sign the order for the approved drug(s) given by our call center nurse on your behalf. Fax to [phone] within 5 business days. Thank you. Date Louis Evans Time): 08/05/2016 7:57:38 PM Triage RN: Louis Scale, RN NAME: Louis Evans PHONE NUMBER: [phone] (Primary), [phone] (Secondary) BIRTHDATE: [birth year] ADDRESS: CITY/STATE/ZIP: [place] CALLER: Spouse NAME: Louis Evans Rx Given Medication Refill Route Dosage Regime Duration Admin Instructions User Name Levothyroxine Oral 193mcg 30 Days Take one tablet by mouth daily. Louis Agar, RN, Louis Bake MD Signature

## 2016-08-23 ENCOUNTER — Encounter: Payer: Self-pay | Admitting: Internal Medicine

## 2016-08-23 ENCOUNTER — Ambulatory Visit (INDEPENDENT_AMBULATORY_CARE_PROVIDER_SITE_OTHER): Payer: BLUE CROSS/BLUE SHIELD | Admitting: Internal Medicine

## 2016-08-23 ENCOUNTER — Encounter: Payer: Self-pay | Admitting: *Deleted

## 2016-08-23 VITALS — BP 110/70 | HR 60 | Temp 98.0°F | Ht 67.0 in | Wt 141.0 lb

## 2016-08-23 DIAGNOSIS — E039 Hypothyroidism, unspecified: Secondary | ICD-10-CM

## 2016-08-23 DIAGNOSIS — Z0000 Encounter for general adult medical examination without abnormal findings: Secondary | ICD-10-CM | POA: Diagnosis not present

## 2016-08-23 DIAGNOSIS — M791 Myalgia, unspecified site: Secondary | ICD-10-CM | POA: Insufficient documentation

## 2016-08-23 LAB — CBC WITH DIFFERENTIAL/PLATELET
Basophils Absolute: 0.1 10*3/uL (ref 0.0–0.1)
Basophils Relative: 1.2 % (ref 0.0–3.0)
Eosinophils Absolute: 0.2 10*3/uL (ref 0.0–0.7)
Eosinophils Relative: 2.4 % (ref 0.0–5.0)
HCT: 42.6 % (ref 39.0–52.0)
Hemoglobin: 13.7 g/dL (ref 13.0–17.0)
Lymphocytes Relative: 23.1 % (ref 12.0–46.0)
Lymphs Abs: 1.8 10*3/uL (ref 0.7–4.0)
MCHC: 32.1 g/dL (ref 30.0–36.0)
MCV: 80.9 fl (ref 78.0–100.0)
Monocytes Absolute: 0.8 10*3/uL (ref 0.1–1.0)
Monocytes Relative: 10.1 % (ref 3.0–12.0)
Neutro Abs: 5 10*3/uL (ref 1.4–7.7)
Neutrophils Relative %: 63.2 % (ref 43.0–77.0)
Platelets: 278 10*3/uL (ref 150.0–400.0)
RBC: 5.26 Mil/uL (ref 4.22–5.81)
RDW: 15.2 % (ref 11.5–15.5)
WBC: 8 10*3/uL (ref 4.0–10.5)

## 2016-08-23 LAB — COMPREHENSIVE METABOLIC PANEL
ALT: 21 U/L (ref 0–53)
AST: 24 U/L (ref 0–37)
Albumin: 4 g/dL (ref 3.5–5.2)
Alkaline Phosphatase: 50 U/L (ref 39–117)
BUN: 14 mg/dL (ref 6–23)
CO2: 27 mEq/L (ref 19–32)
Calcium: 9.3 mg/dL (ref 8.4–10.5)
Chloride: 105 mEq/L (ref 96–112)
Creatinine, Ser: 1.2 mg/dL (ref 0.40–1.50)
GFR: 66.2 mL/min (ref >60.00)
Glucose, Bld: 92 mg/dL (ref 70–99)
Potassium: 4.5 mEq/L (ref 3.5–5.1)
Sodium: 138 mEq/L (ref 135–145)
Total Bilirubin: 1.3 mg/dL — ABNORMAL HIGH (ref 0.2–1.2)
Total Protein: 6.8 g/dL (ref 6.0–8.3)

## 2016-08-23 LAB — TSH: TSH: 1.75 u[IU]/mL (ref 0.35–4.50)

## 2016-08-23 LAB — SEDIMENTATION RATE: Sed Rate: 9 mm/hr (ref 0–20)

## 2016-08-23 LAB — T4, FREE: Free T4: 1.18 ng/dL (ref 0.60–1.60)

## 2016-08-23 NOTE — Assessment & Plan Note (Signed)
Healthy Discussed fitness Colon due 2022 Defer PSA to next year Yearly flu vaccine

## 2016-08-23 NOTE — Assessment & Plan Note (Signed)
Seems euthyroid ?Will check labs ?

## 2016-08-23 NOTE — Assessment & Plan Note (Signed)
In arms No real weakness Discussed resistance training Will check routine labs and ESR

## 2016-08-23 NOTE — Progress Notes (Signed)
Subjective:    Patient ID: Louis Evans, male    DOB: [birth year], 56 y.o.   MRN: [id number]  HPI Here for physical  Feels "weak" Pain in arms picking things up Works hard every day--with equipment, etc Trouble picking up granddaughter---mostly weakness in arms No weight training  No exercise at all Not that careful with how he is eating  Still has the tamsulosin Hasn't needed it recently  Misses his thyroid dose at times--- often on weekends  Current Outpatient Prescriptions on File Prior to Visit  Medication Sig Dispense Refill  . levothyroxine (SYNTHROID, LEVOTHROID) 100 MCG tablet TAKE 1 TABLET (100 MCG TOTAL) BY MOUTH DAILY BEFORE BREAKFAST. 90 tablet 0  . tamsulosin (FLOMAX) 0.4 MG CAPS capsule TAKE 1 CAPSULE (0.4 MG TOTAL) BY MOUTH DAILY. (Patient taking differently: TAKE 1 CAPSULE (0.4 MG TOTAL) BY MOUTH PRN) 90 capsule 3   No current facility-administered medications on file prior to visit.     No Known Allergies  Past Medical History:  Diagnosis Date  . Allergy   . BPH (benign prostatic hypertrophy)   . History of BPH   . Hyperlipidemia    mild  . Hypothyroidism   . 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 Post-operative nausea and vomiting   . Tobacco use disorder     Past Surgical History:  Procedure Laterality Date  . FINGER SURGERY     right hand middle finger/ cut nerve  . KNEE SURGERY     left knee    Family History  Problem Relation Age of Onset  . Hypertension Mother   . Diabetes Mother   . Diabetes Sister   . CAD Neg Hx   . Cancer Neg Hx        prostate or colon    Social History   Social History  . Marital status: Married    Spouse name: N/A  . Number of children: 3  . Years of education: N/A   Occupational History  . Runs fork lift         Social History Main Topics  . Smoking status: Former Smoker    Types: Cigarettes    Quit date: 09/21/2011  . Smokeless tobacco: Never Used  . Alcohol  use 1.2 oz/week    2 Cans of beer per week  . Drug use: No  . Sexual activity: Not on file   Other Topics Concern  . Not on file   Social History Narrative  . No narrative on file   Review of Systems  Constitutional: Negative for unexpected weight change.       Wears seat belt  HENT: Positive for hearing loss and tinnitus. Negative for dental problem.        Wife concerned about his hearing Keeps up with dentist  Eyes: Negative for visual disturbance.       No diplopia or unilateral vision loss  Respiratory: Negative for cough, chest tightness and shortness of breath.   Cardiovascular: Negative for chest pain, palpitations and leg swelling.  Gastrointestinal: Negative for abdominal pain, blood in stool, constipation and nausea.  Endocrine: Negative for polydipsia and polyuria.  Genitourinary: Negative for frequency and urgency.       No ED  Musculoskeletal: Positive for myalgias.  Skin: Negative for rash.       No suspicious lesions  Allergic/Immunologic: Negative for environmental allergies and immunocompromised state.  Neurological: Negative for dizziness, syncope, light-headedness and headaches.  Hematological: Negative  for adenopathy. Does not bruise/bleed easily.  Psychiatric/Behavioral: Negative for dysphoric mood and sleep disturbance.       Gets angry quickly---job is tough       Objective:   Physical Exam  Constitutional: He is oriented to person, place, and time. He appears well-nourished. No distress.  HENT:  Head: Normocephalic and atraumatic.  Right Ear: External ear normal.  Left Ear: External ear normal.  Mouth/Throat: Oropharynx is clear and moist. No oropharyngeal exudate.  Eyes: Pupils are equal, round, and reactive to light. Conjunctivae are normal.  Neck: Normal range of motion. Neck supple. No thyromegaly present.  Cardiovascular: Normal rate, regular rhythm, normal heart sounds and intact distal pulses.  Exam reveals no gallop.   No murmur  heard. Pulmonary/Chest: Effort normal and breath sounds normal. No respiratory distress. He has no wheezes. He has no rales.  Abdominal: Soft. There is no tenderness.  Musculoskeletal: He exhibits no edema or tenderness.  Lymphadenopathy:    He has no cervical adenopathy.  Neurological: He is alert and oriented to person, place, and time.  Skin: No rash noted. No erythema.  Psychiatric: He has a normal mood and affect. His behavior is normal.          Assessment & Plan:

## 2016-09-02 ENCOUNTER — Other Ambulatory Visit: Payer: Self-pay | Admitting: Internal Medicine

## 2016-10-17 ENCOUNTER — Ambulatory Visit (INDEPENDENT_AMBULATORY_CARE_PROVIDER_SITE_OTHER): Payer: BLUE CROSS/BLUE SHIELD | Admitting: Family Medicine

## 2016-10-17 ENCOUNTER — Encounter: Payer: Self-pay | Admitting: Family Medicine

## 2016-10-17 VITALS — BP 118/76 | HR 72 | Temp 97.9°F | Wt 145.0 lb

## 2016-10-17 DIAGNOSIS — T23152A Burn of first degree of left palm, initial encounter: Secondary | ICD-10-CM

## 2016-10-17 DIAGNOSIS — T22112A Burn of first degree of left forearm, initial encounter: Secondary | ICD-10-CM
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
## 2016-10-17 NOTE — Patient Instructions (Signed)
Burn Care, Adult  A burn is an injury to the skin or the tissues under the skin. There are three types of burns:  · First degree. These burns may cause the skin to be red and slightly swollen.  · Second degree. These burns are very painful and cause the skin to be very red. The skin may also leak fluid, look shiny, and develop blisters.  · Third degree. These burns cause permanent damage. They either turn the skin white or black and make it look charred, dry, and leathery.    Taking care of your burn properly can help to prevent pain and infection. It can also help the burn to heal more quickly.  What are the risks?  Complications from burns include:  · Damage to the skin.  · Reduced blood flow near the injury.  · Dead tissue.  · Scarring.  · Problems with movement, if the burn happened near a joint or on the hands or feet.    Severe burns can lead to problems that affect the whole body, such as:  · Fluid loss.  · Less blood circulating in the body.  · Inability to maintain a normal core body temperature (thermoregulation).  · Infection.  · Shock.  · Problems breathing.    How to care for a first-degree burn  Right after a burn:  · Rinse or soak the burn under cool water until the pain stops. Do not put ice on your burn. This can cause more damage.  · Lightly cover the burn with a sterile cloth (dressing).  Burn care  · Follow instructions from your health care provider about:  ? How to clean and take care of the burn.  ? When to change and remove the dressing.  · Check your burn every day for signs of infection. Check for:  ? More redness, swelling, or pain.  ? Warmth.  ? Pus or a bad smell.  Medicine  · Take over-the-counter and prescription medicines only as told by your health care provider.  · If you were prescribed antibiotic medicine, take or apply it as told by your health care provider. Do not stop using the antibiotic even if your condition improves.  General instructions  · To prevent infection, do not  put butter, oil, or other home remedies on your burn.  · Do not rub your burn, even when you are cleaning it.  · Protect your burn from the sun.  How to care for a second-degree burn  Right after a burn:  · Rinse or soak the burn under cool water. Do this for several minutes. Do not put ice on your burn. This can cause more damage.  · Lightly cover the burn with a sterile cloth (dressing).  Burn care  · Raise (elevate) the injured area above the level of your heart while sitting or lying down.  · Follow instructions from your health care provider about:  ? How to clean and take care of the burn.  ? When to change and remove the dressing.  · Check your burn every day for signs of infection. Check for:  ? More redness, swelling, or pain.  ? Warmth.  ? Pus or a bad smell.  Medicine    · Take over-the-counter and prescription medicines only as told by your health care provider.  · If you were prescribed antibiotic medicine, take or apply it as told by your health care provider. Do not stop using the antibiotic even if your   condition improves.  General instructions  · To prevent infection:  ? Do not put butter, oil, or other home remedies on the burn.  ? Do not scratch or pick at the burn.  ? Do not break any blisters.  ? Do not peel skin.  · Do not rub your burn, even when you are cleaning it.  · Protect your burn from the sun.  How to care for a third-degree burn  Right after a burn:  · Lightly cover the burn with gauze.  · Seek immediate medical attention.  Burn care  · Raise (elevate) the injured area above the level of your heart while sitting or lying down.  · Drink enough fluid to keep your urine clear or pale yellow.  · Rest as told by your health care provider. Do not participate in sports or other physical activities until your health care provider approves.  · Follow instructions from your health care provider about:  ? How to clean and take care of the burn.  ? When to change and remove the dressing.  · Check  your burn every day for signs of infection. Check for:  ? More redness, swelling, or pain.  ? Warmth.  ? Pus or a bad smell.  Medicine  · Take over-the-counter and prescription medicines only as told by your health care provider.  · If you were prescribed antibiotic medicine, take or apply it as told by your health care provider. Do not stop using the antibiotic even if your condition improves.  General instructions  · To prevent infection:  ? Do not put butter, oil, or other home remedies on the burn.  ? Do not scratch or pick at the burn.  ? Do not break any blisters.  ? Do not peel skin.  ? Do not rub your burn, even when you are cleaning it.  · Protect your burn from the sun.  · Keep all follow-up visits as told by your health care provider. This is important.  Contact a health care provider if:  · Your condition does not improve.  · Your condition gets worse.  · You have a fever.  · Your burn changes in appearance or develops black or red spots.  · Your burn feels warm to the touch.  · Your pain is not controlled with medicine.  Get help right away if:  · You have redness, swelling, or pain at the site of the burn.  · You have fluid, blood, or pus coming from your burn.  · You have red streaks near the burn.  · You have severe pain.  This information is not intended to replace advice given to you by your health care provider. Make sure you discuss any questions you have with your health care provider.  Document Released: 01/03/2005 Document Revised: 07/26/2015 Document Reviewed: 06/23/2015  [employer] Interactive Patient Education © 2018 [employer]

## 2016-10-25 ENCOUNTER — Ambulatory Visit (INDEPENDENT_AMBULATORY_CARE_PROVIDER_SITE_OTHER): Payer: BLUE CROSS/BLUE SHIELD | Admitting: Internal Medicine

## 2016-10-25 ENCOUNTER — Encounter: Payer: Self-pay | Admitting: Internal Medicine

## 2016-10-25 ENCOUNTER — Ambulatory Visit (INDEPENDENT_AMBULATORY_CARE_PROVIDER_SITE_OTHER)
Admission: RE | Admit: 2016-10-25 | Discharge: 2016-10-25 | Disposition: A | Discharge status: Home / Self Care | Payer: BLUE CROSS/BLUE SHIELD | Source: Ambulatory Visit | Attending: Internal Medicine | Admitting: Internal Medicine

## 2016-10-25 VITALS — BP 112/62 | HR 56 | Temp 98.0°F | Wt 144.0 lb

## 2016-10-25 DIAGNOSIS — T22112D Burn of first degree of left forearm, subsequent encounter: Secondary | ICD-10-CM | POA: Diagnosis not present

## 2016-10-25 DIAGNOSIS — M79674 Pain in right toe(s): Secondary | ICD-10-CM

## 2016-10-25 NOTE — Patient Instructions (Signed)
Toe Fracture A toe fracture is a break in one of the toe bones (phalanges). Follow these instructions at home: If you have a cast:  Do not stick anything inside the cast to scratch your skin.  Check the skin around the cast every day. Tell your doctor about any concerns. Do not put lotion on the skin underneath the cast. You may put lotion on dry skin around the edges of the cast.  Do not put pressure on any part of the cast until it is fully hardened. This may take many hours.  Keep the cast clean and dry. Bathing  Do not take baths, swim, or use a hot tub until your doctor says that you can. Ask your doctor if you can take showers. You may only be allowed to take sponge baths for bathing.  If your doctor says that bathing and showering are okay, cover the cast or bandage (dressing) with a watertight plastic bag to protect it from water. Do not let the cast or bandage get wet. Managing pain, stiffness, and swelling  If you do not have a cast, put ice on the injured area if told by your doctor: ? Put ice in a plastic bag. ? Place a towel between your skin and the bag. ? Leave the ice on for 20 minutes, 2-3 times per day.  Move your toes often to avoid stiffness and to lessen swelling.  Raise (elevate) the injured area above the level of your heart while you are sitting or lying down. Driving  Do not drive or use heavy machinery while taking pain medicine.  Do not drive while wearing a cast on a foot that you use for driving. Activity  Return to your normal activities as told by your doctor. Ask your doctor what activities are safe for you.  Perform exercises daily as told by your doctor or therapist. Safety  Do not use your leg to support your body weight until your doctor says that you can. Use crutches or other tools to help you move around as told by your doctor. General instructions  If your toe was taped to a toe that is next to it (buddy taping), follow your doctor's  instructions for changing the gauze and tape. Change it more often: ? If the gauze and tape get wet. If this happens, dry the space between the toes. ? If the gauze and tape are too tight and they cause your toe to become pale or to lose feeling (numb).  Wear a protective shoe as told by your doctor. If you were not given one, wear sturdy shoes that support your foot. Your shoes should not pinch your toes. Your shoes should not fit tightly against your toes.  Do not use any tobacco products, including cigarettes, chewing tobacco, or e-cigarettes. Tobacco can delay bone healing. If you need help quitting, ask your doctor.  Take medicines only as told by your doctor.  Keep all follow-up visits as told by your doctor. This is important. Contact a doctor if:  You have a fever.  Your pain medicine is not helping.  Your toe feels cold.  You lose feeling (have numbness) in your toe.  You still have pain after one week of rest and treatment.  You still have pain after your doctor has said that you can start walking again.  You have pain or tingling in your foot, and it is not going away.  You have loss of feeling in your foot, and it is   not going away. Get help right away if:  You have severe pain.  You have redness or swelling (inflammation) in your toe, and it is getting worse.  You have pain or loss of feeling in your toe, and it is getting worse.  Your toe is blue. This information is not intended to replace advice given to you by your health care provider. Make sure you discuss any questions you have with your health care provider. Document Released: 06/22/2007 Document Revised: 09/07/2015 Document Reviewed: 10/30/2013 Elsevier Interactive Patient Education  2018 Elsevier Inc.  
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
## 2016-10-26 ENCOUNTER — Telehealth: Payer: Self-pay | Admitting: Internal Medicine

## 2016-10-26 NOTE — Telephone Encounter (Signed)
Best number [phone] Spouse called checking on xray results Please call pt

## 2016-10-26 NOTE — Telephone Encounter (Signed)
See results note. 

## 2017-06-05 ENCOUNTER — Ambulatory Visit: Payer: BLUE CROSS/BLUE SHIELD | Admitting: Family Medicine

## 2017-06-05 ENCOUNTER — Encounter: Payer: Self-pay | Admitting: Family Medicine

## 2017-06-05 ENCOUNTER — Other Ambulatory Visit: Payer: Self-pay

## 2017-06-05 VITALS — BP 100/66 | HR 70 | Temp 98.4°F | Ht 67.0 in | Wt 143.5 lb

## 2017-06-05 DIAGNOSIS — W57XXXA Bitten or stung by nonvenomous insect and other nonvenomous arthropods, initial encounter: Secondary | ICD-10-CM

## 2017-06-05 DIAGNOSIS — L03314 Cellulitis of groin: Secondary | ICD-10-CM | POA: Diagnosis not present

## 2017-06-05 MED ORDER — DOXYCYCLINE HYCLATE 100 MG PO TABS: 100.0000 mg | ORAL_TABLET | Freq: Two times a day (BID) | ORAL | 0 refills | Status: AC

## 2017-06-05 NOTE — Progress Notes (Signed)
Dr. Frederico Hamman T. Teauna Dubach, MD, [facility] [place], [address] Phone: [phone] Fax: [phone]  06/05/2017  Patient: Louis Evans, MRN: [id number], DOB: [birth year], 57 y.o.  Primary Physician:  Venia Carbon, MD   Chief Complaint  Patient presents with  . Insect Bite    Tick Bite   Subjective:   Louis Evans is a 57 y.o. very pleasant male patient who presents with the following:  Healthy gentleman who very recently got a tick bite in his left groin, and in this particular area and now he has had expanding redness as well as warmth and mild pain in the groin region surrounding the tick bite.  He has been able to get the entirety of the tick out.  There is no bull's-eye lesion.  He has had many tick bites in the past and never had anything similar to this.  Past Medical History, Surgical History, Social History, Family History, Problem List, Medications, and Allergies have been reviewed and updated if relevant.  Patient Active Problem List   Diagnosis Date Noted  . Myalgia 08/23/2016  . Adenomatous polyp of colon 07/03/2015  . Hypothyroidism   . BPH (benign prostatic hypertrophy)   . Routine general medical examination at a health care facility 06/18/2012  . Hyperlipidemia   . ALLERGIC RHINITIS 05/28/2008    Past Medical History:  Diagnosis Date  . Allergy   . BPH (benign prostatic hypertrophy)   . History of BPH   . Hyperlipidemia    mild  . Hypothyroidism   . 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 Post-operative nausea and vomiting   . Tobacco use disorder     Past Surgical History:  Procedure Laterality Date  . FINGER SURGERY     right hand middle finger/ cut nerve  . KNEE SURGERY     left knee    Social History   Socioeconomic History  . Marital status: Married    Spouse name: Not on file  . Number of children: 3  . Years of education: Not on file    . Highest education level: Not on file  Occupational History  . Occupation: [employer] laboratory technician    Comment:    Social Needs  . Financial resource strain: Not on file  . Food insecurity:    Worry: Not on file    Inability: Not on file  . Transportation needs:    Medical: Not on file    Non-medical: Not on file  Tobacco Use  . Smoking status: Former Smoker    Types: Cigarettes    Last attempt to quit: 09/21/2011    Years since quitting: 5.7  . Smokeless tobacco: Never Used  Substance and Sexual Activity  . Alcohol use: Yes    Alcohol/week: 1.2 oz    Types: 2 Cans of beer per week  . Drug use: No  . Sexual activity: Not on file  Lifestyle  . Physical activity:    Days per week: Not on file    Minutes per session: Not on file  . Stress: Not on file  Relationships  . Social connections:    Talks on phone: Not on file    Gets together: Not on file    Attends religious service: Not on file    Active member of club or organization: Not on file    Attends meetings of clubs or organizations: Not on file  Relationship status: Not on file  . Intimate partner violence:    Fear of current or ex partner: Not on file    Emotionally abused: Not on file    Physically abused: Not on file    Forced sexual activity: Not on file  Other Topics Concern  . Not on file  Social History Narrative  . Not on file    Family History  Problem Relation Age of Onset  . Hypertension Mother   . Diabetes Mother   . Diabetes Sister   . CAD Neg Hx   . Cancer Neg Hx        prostate or colon    No Known Allergies  Medication list reviewed and updated in full in [facility].   GEN: No acute illnesses, no fevers, chills. GI: No n/v/d, eating normally Pulm: No SOB Interactive and getting along well at home.  Otherwise, ROS is as per the HPI.  Objective:   BP 100/66   Pulse 70   Temp 98.4 F (36.9 C) (Oral)   Ht 5\' 7"  (1.702 m)   Wt 143 lb 8 oz (65.1 kg)   BMI 22.48 kg/m   GEN:  WDWN, NAD, Non-toxic, A & O x 3 HEENT: Atraumatic, Normocephalic. Neck supple. No masses, No LAD. Ears and Nose: No external deformity. CV: RRR, No M/G/R. No JVD. No thrill. No extra heart sounds. PULM: CTA B, no wheezes, crackles, rhonchi. No retractions. No resp. distress. No accessory muscle use. EXTR: No c/c/e NEURO Normal gait.  PSYCH: Normally interactive. Conversant. Not depressed or anxious appearing.  Calm demeanor.   SKIN: It is approximately 8 to 9 inches across and warm to touch without any fluctuance or induration In the left groin, the patient has an area of significant redness and warmth  Laboratory and Imaging Data:  Assessment and Plan:   Cellulitis of left groin  Tick bite, initial encounter  Given that the instigating process was secondary to tick bite, treat with doxycycline.  He is going to call if he does not improve within the next couple of days, and this area was marked with sharpie.  Follow-up: No follow-ups on file.  Meds ordered this encounter  Medications  . doxycycline (VIBRA-TABS) 100 MG tablet    Sig: Take 1 tablet (100 mg total) by mouth 2 (two) times daily for 10 days.    Dispense:  20 tablet    Refill:  0   There are no discontinued medications. No orders of the defined types were placed in this encounter.   Signed,  Maud Deed. Gracilyn Gunia, MD   Allergies as of 06/05/2017   No Known Allergies     Medication List        Accurate as of 06/05/17 11:59 PM. Always use your most recent med list.          doxycycline 100 MG tablet Commonly known as:  VIBRA-TABS Take 1 tablet (100 mg total) by mouth 2 (two) times daily for 10 days.   levothyroxine 100 MCG tablet Commonly known as:  SYNTHROID, LEVOTHROID TAKE 1 TABLET BY MOUTH EVERY DAY BEFORE BREAKFAST   tamsulosin 0.4 MG Caps capsule Commonly known as:  FLOMAX TAKE 1 CAPSULE (0.4 MG TOTAL) BY MOUTH DAILY.

## 2017-06-19 ENCOUNTER — Other Ambulatory Visit: Payer: Self-pay | Admitting: Internal Medicine

## 2017-08-17 ENCOUNTER — Other Ambulatory Visit: Payer: Self-pay | Admitting: Internal Medicine

## 2017-08-25 ENCOUNTER — Encounter: Payer: Self-pay | Admitting: Internal Medicine

## 2017-08-25 ENCOUNTER — Ambulatory Visit (INDEPENDENT_AMBULATORY_CARE_PROVIDER_SITE_OTHER): Payer: BLUE CROSS/BLUE SHIELD | Admitting: Internal Medicine

## 2017-08-25 VITALS — BP 110/62 | HR 62 | Temp 98.0°F | Ht 66.75 in | Wt 140.5 lb

## 2017-08-25 DIAGNOSIS — Z0000 Encounter for general adult medical examination without abnormal findings: Secondary | ICD-10-CM

## 2017-08-25 DIAGNOSIS — E039 Hypothyroidism, unspecified: Secondary | ICD-10-CM | POA: Diagnosis not present

## 2017-08-25 DIAGNOSIS — Z125 Encounter for screening for malignant neoplasm of prostate: Secondary | ICD-10-CM

## 2017-08-25 DIAGNOSIS — E785 Hyperlipidemia, unspecified: Secondary | ICD-10-CM | POA: Diagnosis not present

## 2017-08-25 DIAGNOSIS — N401 Enlarged prostate with lower urinary tract symptoms: Secondary | ICD-10-CM | POA: Diagnosis not present

## 2017-08-25 DIAGNOSIS — N138 Other obstructive and reflux uropathy: Secondary | ICD-10-CM

## 2017-08-25 LAB — CBC
HCT: 42.4 % (ref 39.0–52.0)
Hemoglobin: 14 g/dL (ref 13.0–17.0)
MCHC: 32.9 g/dL (ref 30.0–36.0)
MCV: 80.4 fl (ref 78.0–100.0)
Platelets: 286 10*3/uL (ref 150.0–400.0)
RBC: 5.27 Mil/uL (ref 4.22–5.81)
RDW: 14.8 % (ref 11.5–15.5)
WBC: 8.5 10*3/uL (ref 4.0–10.5)

## 2017-08-25 LAB — COMPREHENSIVE METABOLIC PANEL
ALT: 27 U/L (ref 0–53)
AST: 29 U/L (ref 0–37)
Albumin: 4.1 g/dL (ref 3.5–5.2)
Alkaline Phosphatase: 56 U/L (ref 39–117)
BUN: 14 mg/dL (ref 6–23)
CO2: 27 mEq/L (ref 19–32)
Calcium: 9.5 mg/dL (ref 8.4–10.5)
Chloride: 106 mEq/L (ref 96–112)
Creatinine, Ser: 1.23 mg/dL (ref 0.40–1.50)
GFR: 64.12 mL/min (ref >60.00)
Glucose, Bld: 81 mg/dL (ref 70–99)
Potassium: 4.4 mEq/L (ref 3.5–5.1)
Sodium: 139 mEq/L (ref 135–145)
Total Bilirubin: 1.3 mg/dL — ABNORMAL HIGH (ref 0.2–1.2)
Total Protein: 6.9 g/dL (ref 6.0–8.3)

## 2017-08-25 LAB — LIPID PANEL
Cholesterol: 185 mg/dL (ref 0–200)
HDL: 52.7 mg/dL (ref >39.00)
LDL Cholesterol: 117 mg/dL — ABNORMAL HIGH (ref 0–99)
NonHDL: 132.32
Total CHOL/HDL Ratio: 4
Triglycerides: 75 mg/dL (ref 0.0–149.0)
VLDL: 15 mg/dL (ref 0.0–40.0)

## 2017-08-25 LAB — T4, FREE: Free T4: 1.2 ng/dL (ref 0.60–1.60)

## 2017-08-25 LAB — TSH: TSH: 1.01 u[IU]/mL (ref 0.35–4.50)

## 2017-08-25 NOTE — Progress Notes (Signed)
Subjective:    Patient ID: Louis Evans, male    DOB: 1960/08/28, 57 y.o.   MRN: [id number]  HPI Here for physical  [facility] join the [employer] Started working out and left shoulder/arm is better Has been happy about the result  Urinary stream is slow---night and AM No daytime problems Empties fine  Continues on the thyroid medication Forgets meds occasionally on the weekend  Has high cholesterol--mild  Current Outpatient Medications on File Prior to Visit  Medication Sig Dispense Refill  . levothyroxine (SYNTHROID, LEVOTHROID) 100 MCG tablet TAKE 1 TABLET BY MOUTH EVERY DAY BEFORE BREAKFAST 30 tablet 0   No current facility-administered medications on file prior to visit.     No Known Allergies  Past Medical History:  Diagnosis Date  . Allergy   . BPH (benign prostatic hypertrophy)   . History of BPH   . Hyperlipidemia    mild  . Hypothyroidism   . 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 Post-operative nausea and vomiting   . Tobacco use disorder     Past Surgical History:  Procedure Laterality Date  . FINGER SURGERY     right hand middle finger/ cut nerve  . KNEE SURGERY     left knee    Family History  Problem Relation Age of Onset  . Hypertension Mother   . Diabetes Mother   . Diabetes Sister   . CAD Neg Hx   . Cancer Neg Hx        prostate or colon    Social History   Socioeconomic History  . Marital status: Married    Spouse name: Not on file  . Number of children: 3  . Years of education: Not on file  . Highest education level: Not on file  Occupational History  . Occupation: Dealer    Comment: [employer]  Social Needs  . Financial resource strain: Not on file  . Food insecurity:    Worry: Not on file    Inability: Not on file  . Transportation needs:    Medical: Not on file    Non-medical: Not on file  Tobacco Use  . Smoking status: Former Smoker    Types: Cigarettes    Last attempt to quit: 09/21/2011     Years since quitting: 5.9  . Smokeless tobacco: Never Used  Substance and Sexual Activity  . Alcohol use: Yes    Alcohol/week: 2.0 standard drinks    Types: 2 Cans of beer per week  . Drug use: No  . Sexual activity: Not on file  Lifestyle  . Physical activity:    Days per week: Not on file    Minutes per session: Not on file  . Stress: Not on file  Relationships  . Social connections:    Talks on phone: Not on file    Gets together: Not on file    Attends religious service: Not on file    Active member of club or organization: Not on file    Attends meetings of clubs or organizations: Not on file    Relationship status: Not on file  . Intimate partner violence:    Fear of current or ex partner: Not on file    Emotionally abused: Not on file    Physically abused: Not on file    Forced sexual activity: Not on file  Other Topics Concern  . Not on file  Social History Narrative  . Not on  file   Review of Systems  Constitutional: Negative for fatigue and unexpected weight change.       Wears seat belt  HENT: Positive for hearing loss and tinnitus.        Hasn't been wearing his hearing aides Only a few lower teeth  Eyes: Negative for visual disturbance.       No diplopia or unilateral vision loss  Respiratory: Negative for shortness of breath.        Occ allergy cough  Cardiovascular: Negative for chest pain, palpitations and leg swelling.  Gastrointestinal: Negative for blood in stool and constipation.       Heartburn depending on what he eats---tums work  Endocrine: Negative for polydipsia and polyuria.  Genitourinary: Negative for difficulty urinating and urgency.  Musculoskeletal: Positive for arthralgias. Negative for back pain and joint swelling.  Allergic/Immunologic: Negative for immunocompromised state.       Uses allergy med for cough/congestion  Neurological: Negative for dizziness, syncope, light-headedness and headaches.  Hematological: Negative for  adenopathy. Does not bruise/bleed easily.  Psychiatric/Behavioral: Negative for sleep disturbance.       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 He appears well-developed. No distress.  HENT:  Head: Normocephalic and atraumatic.  Right Ear: External ear normal.  Left Ear: External ear normal.  Mouth/Throat: Oropharynx is clear and moist. No oropharyngeal exudate.  Eyes: Pupils are equal, round, and reactive to light. Conjunctivae are normal.  Neck: No thyromegaly present.  Cardiovascular: Normal rate, regular rhythm, normal heart sounds and intact distal pulses. Exam reveals no gallop.  No murmur heard. Respiratory: Effort normal and breath sounds normal. No respiratory distress. He has no wheezes. He has no rales.  GI: Soft. There is no tenderness.  Musculoskeletal: He exhibits no edema or tenderness.  Lymphadenopathy:    He has no cervical adenopathy.  Neurological: He is alert and oriented to person, place, and time.  Skin: No rash noted. No erythema.  Psychiatric: He has a normal mood and affect. His behavior is normal.           Assessment & Plan:

## 2017-08-25 NOTE — Addendum Note (Signed)
Addended by: Pilar Grammes on: 08/25/2017 03:42 PM   Modules accepted: Orders

## 2017-08-25 NOTE — Assessment & Plan Note (Signed)
Healthy Yearly flu vaccine Colon due 2022 Will check PSA after discussion

## 2017-08-25 NOTE — Assessment & Plan Note (Signed)
Mild symptoms only No meds for now

## 2017-08-25 NOTE — Addendum Note (Signed)
Addended by: Lendon Collar on: 08/25/2017 02:47 PM   Modules accepted: Orders

## 2017-08-25 NOTE — Assessment & Plan Note (Signed)
Mild and low risk due to high HDL

## 2017-08-25 NOTE — Assessment & Plan Note (Signed)
Seems to be euthyroid Does miss doses occasionally

## 2017-08-25 NOTE — Addendum Note (Signed)
Addended by: Pilar Grammes on: 08/25/2017 03:41 PM   Modules accepted: Orders

## 2017-08-26 LAB — PSA: PSA: 1 ng/mL (ref <=4.0)

## 2017-09-09 ENCOUNTER — Other Ambulatory Visit: Payer: Self-pay | Admitting: Internal Medicine

## 2018-08-31 ENCOUNTER — Ambulatory Visit (INDEPENDENT_AMBULATORY_CARE_PROVIDER_SITE_OTHER): Payer: BC Managed Care – PPO | Admitting: Internal Medicine

## 2018-08-31 ENCOUNTER — Other Ambulatory Visit: Payer: Self-pay

## 2018-08-31 ENCOUNTER — Encounter: Payer: Self-pay | Admitting: Internal Medicine

## 2018-08-31 VITALS — BP 100/66 | HR 60 | Temp 98.0°F | Ht 67.0 in | Wt 137.0 lb

## 2018-08-31 DIAGNOSIS — E039 Hypothyroidism, unspecified: Secondary | ICD-10-CM

## 2018-08-31 DIAGNOSIS — Z0000 Encounter for general adult medical examination without abnormal findings: Secondary | ICD-10-CM

## 2018-08-31 LAB — COMPREHENSIVE METABOLIC PANEL
ALT: 15 U/L (ref 0–53)
AST: 19 U/L (ref 0–37)
Albumin: 4.1 g/dL (ref 3.5–5.2)
Alkaline Phosphatase: 54 U/L (ref 39–117)
BUN: 11 mg/dL (ref 6–23)
CO2: 29 mEq/L (ref 19–32)
Calcium: 9.5 mg/dL (ref 8.4–10.5)
Chloride: 105 mEq/L (ref 96–112)
Creatinine, Ser: 1.16 mg/dL (ref 0.40–1.50)
GFR: 64.32 mL/min (ref >60.00)
Glucose, Bld: 88 mg/dL (ref 70–99)
Potassium: 4.8 mEq/L (ref 3.5–5.1)
Sodium: 139 mEq/L (ref 135–145)
Total Bilirubin: 1.7 mg/dL — ABNORMAL HIGH (ref 0.2–1.2)
Total Protein: 6.3 g/dL (ref 6.0–8.3)

## 2018-08-31 LAB — CBC
HCT: 43.4 % (ref 39.0–52.0)
Hemoglobin: 14 g/dL (ref 13.0–17.0)
MCHC: 32.2 g/dL (ref 30.0–36.0)
MCV: 80.6 fl (ref 78.0–100.0)
Platelets: 295 10*3/uL (ref 150.0–400.0)
RBC: 5.38 Mil/uL (ref 4.22–5.81)
RDW: 15.4 % (ref 11.5–15.5)
WBC: 9.4 10*3/uL (ref 4.0–10.5)

## 2018-08-31 LAB — TSH: TSH: 1.08 u[IU]/mL (ref 0.35–4.50)

## 2018-08-31 LAB — T4, FREE: Free T4: 1.04 ng/dL (ref 0.60–1.60)

## 2018-08-31 NOTE — Assessment & Plan Note (Signed)
Seems to be euthyroid 

## 2018-08-31 NOTE — Assessment & Plan Note (Signed)
Healthy but burning himself out working 2 jobs--counseled about this Colon due 2022 Will defer PSA to next year Flu vaccine soon

## 2018-08-31 NOTE — Progress Notes (Signed)
Subjective:    Patient ID: Louis Evans, male    DOB: 25-Apr-1960, 58 y.o.   MRN: [id number]  HPI Here for physical Now delivering auto parts in day---cleaning floors, etc at [employer] job is full time--other is part time Got wife a new van--trying to pay it off Counseled about this  Not able to get to the gym Active with both jobs He is "tired" Does have weekends off--but will do yard work, fix someone's car, etc  Current Outpatient Medications on File Prior to Visit  Medication Sig Dispense Refill  . levothyroxine (SYNTHROID, LEVOTHROID) 100 MCG tablet TAKE 1 TABLET BY MOUTH EVERY DAY BEFORE BREAKFAST 30 tablet 11   No current facility-administered medications on file prior to visit.     No Known Allergies  Past Medical History:  Diagnosis Date  . Allergy   . BPH (benign prostatic hypertrophy)   . History of BPH   . Hyperlipidemia    mild  . Hypothyroidism   . 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 Post-operative nausea and vomiting   . Tobacco use disorder     Past Surgical History:  Procedure Laterality Date  . FINGER SURGERY     right hand middle finger/ cut nerve  . KNEE SURGERY     left knee    Family History  Problem Relation Age of Onset  . Hypertension Mother   . Diabetes Mother   . Diabetes Sister   . CAD Neg Hx   . Cancer Neg Hx        prostate or colon    Social History   Socioeconomic History  . Marital status: Married    Spouse name: Not on file  . Number of children: 3  . Years of education: Not on file  . Highest education level: Not on file  Occupational History  . Occupation: Maintenance    Employer: [employer]:    . Occupation: Delivers auto parts--part time  Social Needs  . Financial resource strain: Not on file  . Food insecurity    Worry: Not on file    Inability: Not on file  . Transportation needs    Medical: Not on file    Non-medical: Not on file  Tobacco Use  .  Smoking status: Former Smoker    Types: Cigarettes    Quit date: 09/21/2011    Years since quitting: 6.9  . Smokeless tobacco: Never Used  Substance and Sexual Activity  . Alcohol use: Yes    Alcohol/week: 2.0 standard drinks    Types: 2 Cans of beer per week  . Drug use: No  . Sexual activity: Not on file  Lifestyle  . Physical activity    Days per week: Not on file    Minutes per session: Not on file  . Stress: Not on file  Relationships  . Social Herbalist on phone: Not on file    Gets together: Not on file    Attends religious service: Not on file    Active member of club or organization: Not on file    Attends meetings of clubs or organizations: Not on file    Relationship status: Not on file  . Intimate partner violence    Fear of current or ex partner: Not on file    Emotionally abused: Not on file    Physically abused: Not on file    Forced sexual  activity: Not on file  Other Topics Concern  . Not on file  Social History Narrative  . Not on file   Review of Systems  Constitutional: Positive for fatigue. Negative for unexpected weight change.       Wears seat belt  HENT: Positive for hearing loss. Negative for trouble swallowing.        Has hearing aides Upper plate--overdue for maintenance on lowers  Eyes: Negative for visual disturbance.       No diplopia or unilateral vision loss  Respiratory: Negative for cough, chest tightness and shortness of breath.   Cardiovascular: Negative for chest pain, palpitations and leg swelling.  Gastrointestinal: Negative for abdominal pain, blood in stool and constipation.       No heartburn  Endocrine: Negative for polydipsia and polyuria.  Genitourinary:       Rare nocturia--slower stream No daytime issues   Musculoskeletal: Positive for back pain. Negative for arthralgias and joint swelling.  Skin: Negative for rash.       No suspicious lesion   Allergic/Immunologic: Positive for environmental allergies.  Negative for immunocompromised state.       Uses med prn  Neurological: Negative for dizziness, syncope, light-headedness and headaches.  Hematological: Negative for adenopathy. Does not bruise/bleed easily.  Psychiatric/Behavioral: Negative for dysphoric mood. The patient is not nervous/anxious.        Sleeps okay but not enough       Objective:   Physical Exam  Constitutional: He is oriented to person, place, and time. He appears well-developed. No distress.  HENT:  Head: Normocephalic and atraumatic.  Right Ear: External ear normal.  Left Ear: External ear normal.  Mouth/Throat: Oropharynx is clear and moist. No oropharyngeal exudate.  Eyes: Pupils are equal, round, and reactive to light. Conjunctivae are normal.  Neck: No thyromegaly present.  Cardiovascular: Normal rate, regular rhythm, normal heart sounds and intact distal pulses. Exam reveals no gallop.  No murmur heard. Respiratory: Effort normal and breath sounds normal. No respiratory distress. He has no wheezes. He has no rales.  GI: Soft. There is no abdominal tenderness.  Musculoskeletal:        General: No tenderness or edema.  Lymphadenopathy:    He has no cervical adenopathy.  Neurological: He is alert and oriented to person, place, and time.  Skin: No rash noted. No erythema.  Psychiatric: He has a normal mood and affect. His behavior is normal.           Assessment & Plan:

## 2018-09-23 ENCOUNTER — Other Ambulatory Visit: Payer: Self-pay | Admitting: Internal Medicine

## 2019-08-16 ENCOUNTER — Other Ambulatory Visit: Payer: Self-pay | Admitting: Internal Medicine

## 2019-09-04 ENCOUNTER — Other Ambulatory Visit: Payer: Self-pay

## 2019-09-04 ENCOUNTER — Encounter: Payer: Self-pay | Admitting: Internal Medicine

## 2019-09-04 ENCOUNTER — Ambulatory Visit (INDEPENDENT_AMBULATORY_CARE_PROVIDER_SITE_OTHER): Payer: BC Managed Care – PPO | Admitting: Internal Medicine

## 2019-09-04 VITALS — BP 108/64 | HR 61 | Temp 97.4°F | Ht 67.0 in | Wt 138.0 lb

## 2019-09-04 DIAGNOSIS — Z23 Encounter for immunization: Secondary | ICD-10-CM

## 2019-09-04 DIAGNOSIS — N401 Enlarged prostate with lower urinary tract symptoms: Secondary | ICD-10-CM | POA: Diagnosis not present

## 2019-09-04 DIAGNOSIS — Z0000 Encounter for general adult medical examination without abnormal findings: Secondary | ICD-10-CM | POA: Diagnosis not present

## 2019-09-04 DIAGNOSIS — N138 Other obstructive and reflux uropathy: Secondary | ICD-10-CM | POA: Diagnosis not present

## 2019-09-04 DIAGNOSIS — E039 Hypothyroidism, unspecified: Secondary | ICD-10-CM

## 2019-09-04 LAB — COMPREHENSIVE METABOLIC PANEL
ALT: 12 U/L (ref 0–53)
AST: 14 U/L (ref 0–37)
Albumin: 4 g/dL (ref 3.5–5.2)
Alkaline Phosphatase: 59 U/L (ref 39–117)
BUN: 11 mg/dL (ref 6–23)
CO2: 30 mEq/L (ref 19–32)
Calcium: 9.5 mg/dL (ref 8.4–10.5)
Chloride: 106 mEq/L (ref 96–112)
Creatinine, Ser: 1.22 mg/dL (ref 0.40–1.50)
GFR: 60.48 mL/min (ref >60.00)
Glucose, Bld: 86 mg/dL (ref 70–99)
Potassium: 5 mEq/L (ref 3.5–5.1)
Sodium: 140 mEq/L (ref 135–145)
Total Bilirubin: 0.9 mg/dL (ref 0.2–1.2)
Total Protein: 6.4 g/dL (ref 6.0–8.3)

## 2019-09-04 LAB — CBC
HCT: 44.4 % (ref 39.0–52.0)
Hemoglobin: 14.4 g/dL (ref 13.0–17.0)
MCHC: 32.5 g/dL (ref 30.0–36.0)
MCV: 80.4 fl (ref 78.0–100.0)
Platelets: 267 10*3/uL (ref 150.0–400.0)
RBC: 5.52 Mil/uL (ref 4.22–5.81)
RDW: 15.5 % (ref 11.5–15.5)
WBC: 8.2 10*3/uL (ref 4.0–10.5)

## 2019-09-04 LAB — TSH: TSH: 1.38 u[IU]/mL (ref 0.35–4.50)

## 2019-09-04 LAB — PSA: PSA: 1.33 ng/mL (ref 0.10–4.00)

## 2019-09-04 LAB — T4, FREE: Free T4: 0.96 ng/dL (ref 0.60–1.60)

## 2019-09-04 MED ORDER — LEVOTHYROXINE SODIUM 100 MCG PO TABS: ORAL_TABLET | ORAL | 3 refills | Status: DC

## 2019-09-04 NOTE — Assessment & Plan Note (Signed)
Generally healthy Some mild mood issues--hopes better job will help Td and shingrix today Colon due next year Will check PSA Urged him to take COVID vaccine (and flu vaccine)

## 2019-09-04 NOTE — Assessment & Plan Note (Signed)
Seems euthyroid ?Will check labs ?

## 2019-09-04 NOTE — Assessment & Plan Note (Signed)
Mild symptoms --no meds needed

## 2019-09-04 NOTE — Progress Notes (Signed)
Subjective:    Patient ID: Louis Evans, male    DOB: 1960/03/23, 59 y.o.   MRN: [id number]  HPI Here for physical This visit occurred during the SARS-CoV-2 public health emergency.  Safety protocols were in place, including screening questions prior to the visit, additional usage of staff PPE, and extensive cleaning of exam room while observing appropriate contact time as indicated for disinfecting solutions.   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 levothyroxine (SYNTHROID) 100 MCG tablet TAKE 1 TABLET BY MOUTH EVERY DAY BEFORE BREAKFAST 90 tablet 3   No current facility-administered medications on file prior to visit.    No Known Allergies  Past Medical History:  Diagnosis Date  . Allergy   . BPH (benign prostatic hypertrophy)   . History of BPH   . Hyperlipidemia    mild  . Hypothyroidism   . 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 Post-operative nausea and vomiting   . Tobacco use disorder     Past Surgical History:  Procedure Laterality Date  . FINGER SURGERY     right hand middle finger/ cut nerve  . KNEE SURGERY     left knee    Family History  Problem Relation Age of Onset  . Hypertension Mother   . Diabetes Mother   . Diabetes Sister   . CAD Neg Hx   . Cancer Neg Hx        prostate or colon    Social History   Socioeconomic History  . Marital status: Married    Spouse name: Not on file  . Number of children: 3  . Years of education: Not on file  . Highest education level: Not on file  Occupational History  . Occupation: Maintenance    Employer: [employer]:    . Occupation:    Tobacco Use  . Smoking status: Former Smoker    Types:  Cigarettes    Quit date: 09/21/2011    Years since quitting: 7.9  . Smokeless tobacco: Never Used  Substance and Sexual Activity  . Alcohol use: Yes    Alcohol/week: 2.0 standard drinks    Types: 2 Cans of beer per week  . Drug use: No  . Sexual activity: Not on file  Other Topics Concern  . Not on file  Social History Narrative  . Not on file   Social Determinants of Health   Financial Resource Strain:   . Difficulty of Paying Living Expenses:   Food Insecurity:   . Worried About Charity fundraiser in the Last Year:   . Arboriculturist in the Last Year:   Transportation Needs:   . Film/video editor (Medical):   Marland Kitchen Lack of Transportation (Non-Medical):   Physical Activity:   . Days of Exercise per Week:   . Minutes of Exercise per Session:   Stress:   . Feeling of Stress :   Social Connections:   . Frequency of Communication with Friends and Family:   . Frequency of Social Gatherings with Friends and Family:   . Attends Religious Services:   . Active Member of Clubs or Organizations:   . Attends Archivist Meetings:   .  Marital Status:   Intimate Partner Violence:   . Fear of Current or Ex-Partner:   . Emotionally Abused:   Marland Kitchen Physically Abused:   . Sexually Abused:    Review of Systems  Constitutional: Negative for fatigue and unexpected weight change.       Wears seat belt  HENT: Positive for hearing loss and tinnitus.        Needs hearing aides adjusted Upper plate---2 on bottom  Eyes: Negative for visual disturbance.       No diplopia or unilateral vision loss  Respiratory: Negative for cough, chest tightness and shortness of breath.   Cardiovascular: Negative for chest pain, palpitations and leg swelling.  Gastrointestinal: Negative for blood in stool and constipation.       Occ heartburn--tums helps. Not often  Genitourinary:       Mild symptoms--slow stream No sexual problems  Musculoskeletal: Negative for arthralgias, back pain and joint  swelling.  Skin: Negative for rash.  Allergic/Immunologic: Positive for environmental allergies. Negative for immunocompromised state.       No meds  Neurological: Negative for dizziness, syncope, light-headedness and headaches.  Hematological: Negative for adenopathy. Does not bruise/bleed easily.  Psychiatric/Behavioral: Negative for dysphoric mood and sleep disturbance.       "I fly off the handle easily"       Objective:   Physical Exam Constitutional:      Appearance: Normal appearance.  HENT:     Head: Normocephalic and atraumatic.     Right Ear: Tympanic membrane, ear canal and external ear normal.     Left Ear: Tympanic membrane, ear canal and external ear normal.     Mouth/Throat:     Comments: No lesions Eyes:     Conjunctiva/sclera: Conjunctivae normal.     Pupils: Pupils are equal, round, and reactive to light.  Cardiovascular:     Rate and Rhythm: Normal rate and regular rhythm.     Pulses: Normal pulses.     Heart sounds: No murmur heard.  No gallop.   Pulmonary:     Effort: Pulmonary effort is normal.     Breath sounds: Normal breath sounds. No wheezing or rales.  Abdominal:     Palpations: Abdomen is soft.     Tenderness: There is no abdominal tenderness.  Musculoskeletal:     Cervical back: Neck supple.     Right lower leg: No edema.     Left lower leg: No edema.  Lymphadenopathy:     Cervical: No cervical adenopathy.  Skin:    Findings: No rash.  Neurological:     General: No focal deficit present.     Mental Status: He is alert and oriented to person, place, and time.  Psychiatric:        Mood and Affect: Mood normal.        Behavior: Behavior normal.            Assessment & Plan:

## 2019-09-04 NOTE — Addendum Note (Signed)
Addended by: Pilar Grammes on: 09/04/2019 01:43 PM   Modules accepted: Orders

## 2019-10-15 ENCOUNTER — Other Ambulatory Visit: Payer: BC Managed Care – PPO

## 2019-10-15 DIAGNOSIS — Z20822 Contact with and (suspected) exposure to covid-19: Secondary | ICD-10-CM

## 2019-10-16 LAB — NOVEL CORONAVIRUS, NAA: SARS-CoV-2, NAA: NOT DETECTED

## 2019-10-16 LAB — SARS-COV-2, NAA 2 DAY TAT

## 2019-10-22 ENCOUNTER — Other Ambulatory Visit: Payer: BC Managed Care – PPO

## 2019-11-05 ENCOUNTER — Other Ambulatory Visit: Payer: Self-pay

## 2019-11-05 DIAGNOSIS — Z20822 Contact with and (suspected) exposure to covid-19: Secondary | ICD-10-CM

## 2019-11-07 LAB — SARS-COV-2, NAA 2 DAY TAT

## 2019-11-07 LAB — NOVEL CORONAVIRUS, NAA: SARS-CoV-2, NAA: NOT DETECTED

## 2019-11-12 ENCOUNTER — Other Ambulatory Visit: Payer: Self-pay

## 2019-11-12 DIAGNOSIS — Z20822 Contact with and (suspected) exposure to covid-19: Secondary | ICD-10-CM

## 2019-11-13 LAB — NOVEL CORONAVIRUS, NAA: SARS-CoV-2, NAA: NOT DETECTED

## 2019-11-13 LAB — SARS-COV-2, NAA 2 DAY TAT

## 2019-11-19 ENCOUNTER — Other Ambulatory Visit: Payer: Self-pay

## 2019-11-19 DIAGNOSIS — Z20822 Contact with and (suspected) exposure to covid-19: Secondary | ICD-10-CM

## 2019-11-20 LAB — SARS-COV-2, NAA 2 DAY TAT

## 2019-11-20 LAB — NOVEL CORONAVIRUS, NAA: SARS-CoV-2, NAA: NOT DETECTED

## 2019-11-26 ENCOUNTER — Other Ambulatory Visit: Payer: Self-pay

## 2019-11-26 DIAGNOSIS — Z20822 Contact with and (suspected) exposure to covid-19: Secondary | ICD-10-CM

## 2019-11-27 ENCOUNTER — Other Ambulatory Visit: Payer: Self-pay

## 2019-11-27 DIAGNOSIS — S36039A Unspecified laceration of spleen, initial encounter: Secondary | ICD-10-CM | POA: Diagnosis not present

## 2019-11-27 DIAGNOSIS — E785 Hyperlipidemia, unspecified: Secondary | ICD-10-CM | POA: Diagnosis present

## 2019-11-27 DIAGNOSIS — Y990 Civilian activity done for income or pay: Secondary | ICD-10-CM

## 2019-11-27 DIAGNOSIS — W172XXA Fall into hole, initial encounter: Secondary | ICD-10-CM | POA: Diagnosis present

## 2019-11-27 DIAGNOSIS — S8001XA Contusion of right knee, initial encounter: Secondary | ICD-10-CM | POA: Diagnosis present

## 2019-11-27 DIAGNOSIS — S36031A Moderate laceration of spleen, initial encounter: Principal | ICD-10-CM | POA: Diagnosis present

## 2019-11-27 DIAGNOSIS — E039 Hypothyroidism, unspecified: Secondary | ICD-10-CM | POA: Diagnosis present

## 2019-11-27 DIAGNOSIS — M25561 Pain in right knee: Secondary | ICD-10-CM | POA: Diagnosis present

## 2019-11-27 DIAGNOSIS — Z87891 Personal history of nicotine dependence: Secondary | ICD-10-CM

## 2019-11-27 DIAGNOSIS — Z20822 Contact with and (suspected) exposure to covid-19: Secondary | ICD-10-CM | POA: Diagnosis present

## 2019-11-27 DIAGNOSIS — S83281A Other tear of lateral meniscus, current injury, right knee, initial encounter: Secondary | ICD-10-CM | POA: Diagnosis present

## 2019-11-27 DIAGNOSIS — Y92488 Other paved roadways as the place of occurrence of the external cause: Secondary | ICD-10-CM

## 2019-11-28 ENCOUNTER — Emergency Department (HOSPITAL_COMMUNITY): Payer: No Typology Code available for payment source

## 2019-11-28 ENCOUNTER — Encounter (HOSPITAL_COMMUNITY): Payer: Self-pay | Admitting: Emergency Medicine

## 2019-11-28 ENCOUNTER — Inpatient Hospital Stay (HOSPITAL_COMMUNITY)
Admission: EM | Admit: 2019-11-28 | Discharge: 2019-11-30 | DRG: 816 | Disposition: A | Discharge status: Home / Self Care | Payer: No Typology Code available for payment source | Attending: Surgery | Admitting: Surgery

## 2019-11-28 DIAGNOSIS — Z87891 Personal history of nicotine dependence: Secondary | ICD-10-CM | POA: Diagnosis not present

## 2019-11-28 DIAGNOSIS — E785 Hyperlipidemia, unspecified: Secondary | ICD-10-CM | POA: Diagnosis not present

## 2019-11-28 DIAGNOSIS — S8391XA Sprain of unspecified site of right knee, initial encounter: Secondary | ICD-10-CM

## 2019-11-28 DIAGNOSIS — W172XXA Fall into hole, initial encounter: Secondary | ICD-10-CM | POA: Diagnosis not present

## 2019-11-28 DIAGNOSIS — Z20822 Contact with and (suspected) exposure to covid-19: Secondary | ICD-10-CM | POA: Diagnosis not present

## 2019-11-28 DIAGNOSIS — Y92488 Other paved roadways as the place of occurrence of the external cause: Secondary | ICD-10-CM | POA: Diagnosis not present

## 2019-11-28 DIAGNOSIS — S36039A Unspecified laceration of spleen, initial encounter: Secondary | ICD-10-CM | POA: Diagnosis present

## 2019-11-28 DIAGNOSIS — M25561 Pain in right knee: Secondary | ICD-10-CM | POA: Diagnosis not present

## 2019-11-28 DIAGNOSIS — Y990 Civilian activity done for income or pay: Secondary | ICD-10-CM | POA: Diagnosis not present

## 2019-11-28 DIAGNOSIS — S8001XA Contusion of right knee, initial encounter: Secondary | ICD-10-CM | POA: Diagnosis not present

## 2019-11-28 DIAGNOSIS — S36031A Moderate laceration of spleen, initial encounter: Secondary | ICD-10-CM | POA: Diagnosis not present

## 2019-11-28 DIAGNOSIS — E039 Hypothyroidism, unspecified: Secondary | ICD-10-CM | POA: Diagnosis not present

## 2019-11-28 DIAGNOSIS — S83281A Other tear of lateral meniscus, current injury, right knee, initial encounter: Secondary | ICD-10-CM | POA: Diagnosis not present

## 2019-11-28 LAB — CBC WITH DIFFERENTIAL/PLATELET
Abs Immature Granulocytes: 0.06 10*3/uL (ref 0.00–0.07)
Basophils Absolute: 0.1 10*3/uL (ref 0.0–0.1)
Basophils Relative: 1 %
Eosinophils Absolute: 0.1 10*3/uL (ref 0.0–0.5)
Eosinophils Relative: 1 %
HCT: 40.6 % (ref 39.0–52.0)
Hemoglobin: 13.1 g/dL (ref 13.0–17.0)
Immature Granulocytes: 0 %
Lymphocytes Relative: 11 %
Lymphs Abs: 1.7 10*3/uL (ref 0.7–4.0)
MCH: 26 pg (ref 26.0–34.0)
MCHC: 32.3 g/dL (ref 30.0–36.0)
MCV: 80.7 fL (ref 80.0–100.0)
Monocytes Absolute: 1.4 10*3/uL — ABNORMAL HIGH (ref 0.1–1.0)
Monocytes Relative: 9 %
Neutro Abs: 12 10*3/uL — ABNORMAL HIGH (ref 1.7–7.7)
Neutrophils Relative %: 78 %
Platelets: 305 10*3/uL (ref 150–400)
RBC: 5.03 MIL/uL (ref 4.22–5.81)
RDW: 15.1 % (ref 11.5–15.5)
WBC: 15.4 10*3/uL — ABNORMAL HIGH (ref 4.0–10.5)
nRBC: 0 % (ref 0.0–0.2)

## 2019-11-28 LAB — GLUCOSE, CAPILLARY
Glucose-Capillary: 101 mg/dL — ABNORMAL HIGH (ref 70–99)
Glucose-Capillary: 94 mg/dL (ref 70–99)

## 2019-11-28 LAB — BASIC METABOLIC PANEL
Anion gap: 8 (ref 5–15)
BUN: 15 mg/dL (ref 6–20)
CO2: 23 mmol/L (ref 22–32)
Calcium: 9.3 mg/dL (ref 8.9–10.3)
Chloride: 105 mmol/L (ref 98–111)
Creatinine, Ser: 1.1 mg/dL (ref 0.61–1.24)
GFR, Estimated: >60 mL/min (ref >60)
Glucose, Bld: 113 mg/dL — ABNORMAL HIGH (ref 70–99)
Potassium: 4.4 mmol/L (ref 3.5–5.1)
Sodium: 136 mmol/L (ref 135–145)

## 2019-11-28 LAB — HEMOGLOBIN AND HEMATOCRIT, BLOOD
HCT: 40.6 % (ref 39.0–52.0)
Hemoglobin: 12.9 g/dL — ABNORMAL LOW (ref 13.0–17.0)

## 2019-11-28 LAB — HEPATIC FUNCTION PANEL
ALT: 25 U/L (ref 0–44)
AST: 26 U/L (ref 15–41)
Albumin: 4 g/dL (ref 3.5–5.0)
Alkaline Phosphatase: 54 U/L (ref 38–126)
Bilirubin, Direct: 0.1 mg/dL (ref 0.0–0.2)
Indirect Bilirubin: 0.8 mg/dL (ref 0.3–0.9)
Total Bilirubin: 0.9 mg/dL (ref 0.3–1.2)
Total Protein: 7.1 g/dL (ref 6.5–8.1)

## 2019-11-28 LAB — RESPIRATORY PANEL BY RT PCR (FLU A&B, COVID)
Influenza A by PCR: NEGATIVE
Influenza B by PCR: NEGATIVE
SARS Coronavirus 2 by RT PCR: NEGATIVE

## 2019-11-28 LAB — SARS-COV-2, NAA 2 DAY TAT

## 2019-11-28 LAB — NOVEL CORONAVIRUS, NAA: SARS-CoV-2, NAA: NOT DETECTED

## 2019-11-28 MED ORDER — ACETAMINOPHEN 325 MG PO TABS: 650.0000 mg | ORAL_TABLET | Freq: Four times a day (QID) | ORAL | Status: DC | PRN

## 2019-11-28 MED ORDER — ONDANSETRON 4 MG PO TBDP: 4.0000 mg | ORAL_TABLET | Freq: Four times a day (QID) | ORAL | Status: DC | PRN

## 2019-11-28 MED ORDER — IOHEXOL 300 MG/ML  SOLN
100.0000 mL | Freq: Once | INTRAMUSCULAR | Status: AC | PRN
  Administered 2019-11-28: 100 mL via INTRAVENOUS

## 2019-11-28 MED ORDER — LEVOTHYROXINE SODIUM 100 MCG PO TABS
100.0000 ug | ORAL_TABLET | Freq: Every day | ORAL | Status: DC
  Administered 2019-11-28 – 2019-11-30 (×3): 100 ug via ORAL
  Filled 2019-11-28 (×3): qty 1

## 2019-11-28 MED ORDER — ONDANSETRON HCL 4 MG/2ML IJ SOLN: 4.0000 mg | Freq: Four times a day (QID) | INTRAMUSCULAR | Status: DC | PRN

## 2019-11-28 MED ORDER — SODIUM CHLORIDE (PF) 0.9 % IJ SOLN
INTRAMUSCULAR | Status: AC
  Filled 2019-11-28: qty 50

## 2019-11-28 MED ORDER — HYDROMORPHONE HCL 2 MG/ML IJ SOLN: 2.0000 mg | Freq: Once | INTRAMUSCULAR | Status: DC

## 2019-11-28 MED ORDER — LACTATED RINGERS IV SOLN: INTRAVENOUS | Status: DC

## 2019-11-28 MED ORDER — SODIUM CHLORIDE 0.9 % IV BOLUS
1000.0000 mL | Freq: Once | INTRAVENOUS | Status: AC
  Administered 2019-11-28: 1000 mL via INTRAVENOUS

## 2019-11-28 MED ORDER — HYDROMORPHONE HCL 1 MG/ML IJ SOLN
1.0000 mg | Freq: Once | INTRAMUSCULAR | Status: AC
  Administered 2019-11-28: 1 mg via INTRAVENOUS
  Filled 2019-11-28: qty 1

## 2019-11-28 MED ORDER — OXYCODONE HCL 5 MG PO TABS
5.0000 mg | ORAL_TABLET | ORAL | Status: DC | PRN
  Administered 2019-11-28 – 2019-11-29 (×6): 5 mg via ORAL
  Filled 2019-11-28 (×6): qty 1

## 2019-11-28 MED ORDER — DOCUSATE SODIUM 100 MG PO CAPS
100.0000 mg | ORAL_CAPSULE | Freq: Two times a day (BID) | ORAL | Status: DC
  Administered 2019-11-28 – 2019-11-30 (×5): 100 mg via ORAL
  Filled 2019-11-28 (×5): qty 1

## 2019-11-28 NOTE — ED Notes (Signed)
Lunch Tray Ordered @ 1034. 

## 2019-11-28 NOTE — ED Notes (Signed)
Pt ambulatory to restroom independently.  States pain increases to 9/10 when moving.

## 2019-11-28 NOTE — ED Provider Notes (Signed)
Patient transferred from [facility] emergency department following a fall with resulted in a grade 2 laceration of the spleen.  Patient is resting comfortably but does have significant left upper quadrant abdominal tenderness.  Dr. Zenia Resides of trauma surgery service is being paged to come to evaluate the patient.   Delora Fuel, MD [id number] [phone]

## 2019-11-28 NOTE — ED Notes (Signed)
Pt transferred from [facility] ED to see the trauma team for spleen laceration Pt states pain is tolerable on arrival, was given pain medications prior to transport.   A&Ox4  Does not appear in distress, respirations are even and non-labored  Skin is warm, dry and intact

## 2019-11-28 NOTE — ED Triage Notes (Signed)
Pt arriving with left rib and right knee pain after falling approx 88ft into a hole while [place] the [place].

## 2019-11-28 NOTE — H&P (Addendum)
History   Louis Evans is an 59 y.o. male.   Chief Complaint:  Chief Complaint  Patient presents with  . Fall    Louis Evans is a 59 yo male who presented after a fall. 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 He also hit his right knee. He did not hit his head and denies loss of consciousness. He presented to the [facility] ED. Has been hemodynamically stable since arrival. Labs were unremarkable. Chest XR and right knee films showed no acute fractures. CT abd/pelvis showed a grade 2 splenic laceration. Trauma was consulted for further evaluation. Patient still reports LUQ/left lower chest wall pain, as well as pain on his inner right knee.  Patient has a history of hypothyroidism but no other chronic medical conditions. Prior surgeries include finger surgery on the right hand.    Past Medical History:  Diagnosis Date  . Allergy   . BPH (benign prostatic hypertrophy)   . History of BPH   . Hyperlipidemia    mild  . Hypothyroidism   . 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 Post-operative nausea and vomiting   . Tobacco use disorder     Past Surgical History:  Procedure Laterality Date  . FINGER SURGERY     right hand middle finger/ cut nerve  . KNEE SURGERY     left knee    Family History  Problem Relation Age of Onset  . Hypertension Mother   . Diabetes Mother   . Diabetes Sister   . CAD Neg Hx   . Cancer Neg Hx        prostate or colon   Social History:  reports that he quit smoking about 8 years ago. His smoking use included cigarettes. He has never used smokeless tobacco. He reports current alcohol use of about 2.0 standard drinks of alcohol per week. He reports that he does not use drugs.  Allergies  No Known Allergies  Home Medications  (Not in a hospital admission)   Trauma Course   Results for orders placed or performed during the hospital  encounter of 11/28/19 (from the past 48 hour(s))  Basic metabolic panel     Status: Abnormal   Collection Time: 11/28/19  2:14 AM  Result Value Ref Range   Sodium 136 135 - 145 mmol/L   Potassium 4.4 3.5 - 5.1 mmol/L   Chloride 105 98 - 111 mmol/L   CO2 23 22 - 32 mmol/L   Glucose, Bld 113 (H) 70 - 99 mg/dL    Comment: Glucose reference range applies only to samples taken after fasting for at least 8 hours.   BUN 15 6 - 20 mg/dL   Creatinine, Ser 1.10 0.61 - 1.24 mg/dL   Calcium 9.3 8.9 - 10.3 mg/dL   GFR, Estimated >60 >60 mL/min    Comment: (NOTE) Calculated using the CKD-EPI Creatinine Equation (2021)    Anion gap 8 5 - 15    Comment: Performed at [facility], [place] [address], [place], [place] [address]  CBC with Differential     Status: Abnormal   Collection Time: 11/28/19  2:14 AM  Result Value Ref Range   WBC 15.4 (H) 4.0 - 10.5 K/uL   RBC 5.03 4.22 - 5.81 MIL/uL   Hemoglobin 13.1 13.0 - 17.0 g/dL   HCT 40.6 39 - 52 %  MCV 80.7 80.0 - 100.0 fL   MCH 26.0 26.0 - 34.0 pg   MCHC 32.3 30.0 - 36.0 g/dL   RDW 15.1 11.5 - 15.5 %   Platelets 305 150 - 400 K/uL   nRBC 0.0 0.0 - 0.2 %   Neutrophils Relative % 78 %   Neutro Abs 12.0 (H) 1.7 - 7.7 K/uL   Lymphocytes Relative 11 %   Lymphs Abs 1.7 0.7 - 4.0 K/uL   Monocytes Relative 9 %   Monocytes Absolute 1.4 (H) 0.1 - 1.0 K/uL   Eosinophils Relative 1 %   Eosinophils Absolute 0.1 0.0 - 0.5 K/uL   Basophils Relative 1 %   Basophils Absolute 0.1 0.0 - 0.1 K/uL   Immature Granulocytes 0 %   Abs Immature Granulocytes 0.06 0.00 - 0.07 K/uL    Comment: Performed at [facility], [place] [address], [place], [place] [address]  Hepatic function panel     Status: None   Collection Time: 11/28/19  2:14 AM  Result Value Ref Range   Total Protein 7.1 6.5 - 8.1 g/dL   Albumin 4.0 3.5 - 5.0 g/dL   AST 26 15 - 41 U/L   ALT 25 0 - 44 U/L   Alkaline Phosphatase 54 38 - 126 U/L   Total Bilirubin 0.9  0.3 - 1.2 mg/dL   Bilirubin, Direct 0.1 0.0 - 0.2 mg/dL   Indirect Bilirubin 0.8 0.3 - 0.9 mg/dL    Comment: Performed at [facility], [place] [address] [place], [place] [address]  Respiratory Panel by RT PCR (Flu A&B, Covid) - Nasopharyngeal Swab     Status: None   Collection Time: 11/28/19  4:18 AM   Specimen: Nasopharyngeal Swab  Result Value Ref Range   SARS Coronavirus 2 by RT PCR NEGATIVE NEGATIVE    Comment: (NOTE) SARS-CoV-2 target nucleic acids are NOT DETECTED.  The SARS-CoV-2 RNA is generally detectable in upper respiratoy specimens during the acute phase of infection. The lowest concentration of SARS-CoV-2 viral copies this assay can detect is 131 copies/mL. A negative result does not preclude SARS-Cov-2 infection and should not be used as the sole basis for treatment or other patient management decisions. A negative result may occur with  improper specimen collection/handling, submission of specimen other than nasopharyngeal swab, presence of viral mutation(s) within the areas targeted by this assay, and inadequate number of viral copies (<131 copies/mL). A negative result must be combined with clinical observations, patient history, and epidemiological information. The expected result is Negative.  Fact Sheet for Patients:  PinkCheek.be  Fact Sheet for Healthcare Providers:  GravelBags.it  This test is no t yet approved or cleared by the [place] FDA and  has been authorized for detection and/or diagnosis of SARS-CoV-2 by FDA under an Emergency Use Authorization (EUA). This EUA will remain  in effect (meaning this test can be used) for the duration of the COVID-19 declaration under Section 564(b)(1) of the Act, 21 U.S.C. section 360bbb-3(b)(1), unless the authorization is terminated or revoked sooner.     Influenza A by PCR NEGATIVE NEGATIVE   Influenza B by PCR NEGATIVE NEGATIVE     Comment: (NOTE) The Xpert Xpress SARS-CoV-2/FLU/RSV assay is intended as an aid in  the diagnosis of influenza from Nasopharyngeal swab specimens and  should not be used as a sole basis for treatment. Nasal washings and  aspirates are unacceptable for Xpert Xpress SARS-CoV-2/FLU/RSV  testing.  Fact Sheet for Patients: PinkCheek.be  Fact Sheet  for Healthcare Providers: GravelBags.it  This test is not yet approved or cleared by the [place] and  has been authorized for detection and/or diagnosis of SARS-CoV-2 by  FDA under an Emergency Use Authorization (EUA). This EUA will remain  in effect (meaning this test can be used) for the duration of the  Covid-19 declaration under Section 564(b)(1) of the Act, 21  U.S.C. section 360bbb-3(b)(1), unless the authorization is  terminated or revoked. Performed at [facility], [place] [address], [place], [place] [address]    DG Ribs Unilateral W/Chest Left  Result Date: 11/28/2019 CLINICAL DATA:  Fall, left rib pain EXAM: LEFT RIBS AND CHEST - 3+ VIEW COMPARISON:  None. FINDINGS: No fracture or other bone lesions are seen involving the ribs. There is no evidence of pneumothorax or pleural effusion. Both lungs are clear. Heart size and mediastinal contours are within normal limits. IMPRESSION: Negative. Electronically Signed   By: Fidela Salisbury MD   On: 11/28/2019 01:12   CT ABDOMEN PELVIS W CONTRAST  Addendum Date: 11/28/2019   ADDENDUM REPORT: 11/28/2019 03:54 ADDENDUM: These results were called by telephone at the time of interpretation on 11/28/2019 at 3:52 am to provider Veryl Speak , who verbally acknowledged these results. Electronically Signed   By: Fidela Salisbury MD   On: 11/28/2019 03:54   Result Date: 11/28/2019 CLINICAL DATA:  Abdominal trauma, left rib pain, left flank pain EXAM: CT ABDOMEN AND PELVIS WITH CONTRAST TECHNIQUE: Multidetector CT imaging  of the abdomen and pelvis was performed using the standard protocol following bolus administration of intravenous contrast. CONTRAST:  159mL OMNIPAQUE IOHEXOL 300 MG/ML  SOLN COMPARISON:  None. FINDINGS: Lower chest: Mild bibasilar atelectasis. The visualized lung bases are otherwise clear. The visualized heart and pericardium are unremarkable. Hepatobiliary: No focal liver abnormality is seen. No gallstones, gallbladder wall thickening, or biliary dilatation. Pancreas: Unremarkable Spleen: There is a focal laceration involving the inferior aspect of the spleen with a laceration depth of approximately 2 cm. No active extravasation. Tiny perihepatic hematoma. This is compatible with an AAST grade 2 splenic injury. Adrenals/Urinary Tract: Adrenal glands are unremarkable. Tiny cortical cysts are seen within the kidneys bilaterally. No enhancing renal masses. The kidneys are otherwise unremarkable. Bladder unremarkable. Stomach/Bowel: Stomach is within normal limits. Appendix appears normal. No evidence of bowel wall thickening, distention, or inflammatory changes. No free intraperitoneal gas. Trace high attenuation free fluid within the pelvis within the peritoneal reflection anterior to the rectum in keeping with trace hemoperitoneum. Vascular/Lymphatic: Mild aortoiliac atherosclerotic calcification. No aortic aneurysm. The abdominal vasculature is otherwise unremarkable. Specifically, the splenic vein is patent. Reproductive: Prostate is unremarkable. Other: Rectum unremarkable Musculoskeletal: No acute bone abnormality. IMPRESSION: AAST grade 2 splenic injury with small laceration and tiny perihepatic hematoma. No active extravasation. Attempts are being made at this time to contact the managing physician for direct communication. Electronically Signed: By: Fidela Salisbury MD On: 11/28/2019 03:51   DG Knee Complete 4 Views Right  Result Date: 11/28/2019 CLINICAL DATA:  Fall, right knee pain EXAM: RIGHT KNEE -  COMPLETE 4+ VIEW COMPARISON:  None. FINDINGS: Four view radiograph right knee demonstrates normal alignment. No fracture or dislocation. Medial and lateral compartment joint spaces are preserved. A punctate 2 mm metallic foreign body is seen within the medial subcutaneous soft tissues at the level of the upper pole of the patella. No effusion. IMPRESSION: 2 mm metallic retained foreign body within the medial soft tissues. No acute fracture or dislocation. Electronically Signed  By: Fidela Salisbury MD   On: 11/28/2019 01:11    Review of Systems  Constitutional: Negative for chills and fever.  HENT: Negative for ear pain and facial swelling.   Eyes: Negative for pain and redness.  Respiratory: Negative for shortness of breath and stridor.   Cardiovascular: Positive for chest pain.  Gastrointestinal: Positive for abdominal pain. Negative for nausea and vomiting.  Musculoskeletal: Negative for back pain and joint swelling.       Right knee pain  Skin: Negative for rash and wound.  Allergic/Immunologic: Negative for immunocompromised state.  Neurological: Negative for facial asymmetry and speech difficulty.  Psychiatric/Behavioral: Negative for agitation and confusion.    Blood pressure 106/68, pulse 63, temperature 98.2 F (36.8 C), temperature source Oral, resp. rate 16, height 5\' 8"  (1.727 m), weight 63.5 kg, SpO2 97 %. Physical Exam Constitutional:      General: He is not in acute distress.    Appearance: Normal appearance. He is normal weight.  HENT:     Head: Normocephalic and atraumatic.     Comments: No external signs of injury    Nose: Nose normal.  Eyes:     General: No scleral icterus.    Extraocular Movements: Extraocular movements intact.     Conjunctiva/sclera: Conjunctivae normal.     Pupils: Pupils are equal, round, and reactive to light.  Neck:     Comments: No cervical spinal tenderness to palpation, no stepoffs or deformities. Cardiovascular:     Rate and Rhythm:  Normal rate and regular rhythm.     Pulses: Normal pulses.     Heart sounds: Normal heart sounds.     Comments: Palpable pedal pulses bilaterally. Pulmonary:     Effort: Pulmonary effort is normal. No respiratory distress.     Breath sounds: Normal breath sounds.     Comments: Lungs clear to auscultation bilaterally. No chest wall deformities, ecchymoses or abrasions. Left lower chest wall is tender to palpation. Abdominal:     General: Abdomen is flat. There is no distension.     Palpations: Abdomen is soft. There is no mass.     Comments: Mild LUQ/left flank tenderness to palpation, no rebound tenderness or guarding. No abdominal wall abrasions or ecchymoses.  Musculoskeletal:        General: Normal range of motion.     Cervical back: Normal range of motion. No rigidity or tenderness.     Right lower leg: No edema.     Left lower leg: No edema.     Comments: No extremity abrasions or deformities. Moving all extremities spontaneously. There is mild swelling and tenderness to palpation on the right medial knee, but no abrasions or ecchymosis, and full range of motion is in tact.  Skin:    General: Skin is warm and dry.     Coloration: Skin is not jaundiced.     Findings: No bruising.  Neurological:     General: No focal deficit present.     Mental Status: He is alert and oriented to person, place, and time.  Psychiatric:        Mood and Affect: Mood normal.        Behavior: Behavior normal.        Thought Content: Thought content normal.     Assessment/Plan 59 yo male s/p fall with a grade 2 splenic laceration. Imaging reviewed, no other injuries identified. - Trend hgb/hct q6h - Regular diet - Pain control - PPx: SCDs, chemical DVT ppx on hold in  setting of splenic laceration - Dispo: admit to trauma service for observation to monitor for bleeding, [facility], [facility] 11/28/19 5:52 AM

## 2019-11-28 NOTE — ED Provider Notes (Signed)
[facility] DEPT Provider Note   CSN: [id number] Arrival date & time: 11/27/19  2326     History Chief Complaint  Patient presents with  . Fall    Garvis Downum is a 59 y.o. male.  Patient is a 59 year old male with history of hypothyroidism, hyperlipidemia.  He presents today for evaluation of fall.  [place] when poor visibility caused him to fall into a hole.  Patient injured his right knee and left ribs.  He tells me he fell approximately 3 to 4 feet.  His pain is worse when he moves his leg and attempts to breathe.  He denies shortness of breath.  He denies any head trauma, neck pain, or back pain.  The history is provided by the patient.       Past Medical History:  Diagnosis Date  . Allergy   . BPH (benign prostatic hypertrophy)   . History of BPH   . Hyperlipidemia    mild  . Hypothyroidism   . 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 Post-operative nausea and vomiting   . Tobacco use disorder     Patient Active Problem List   Diagnosis Date Noted  . Adenomatous polyp of colon 07/03/2015  . Hypothyroidism   . BPH with obstruction/lower urinary tract symptoms   . Routine general medical examination at a health care facility 06/18/2012  . ALLERGIC RHINITIS 05/28/2008    Past Surgical History:  Procedure Laterality Date  . FINGER SURGERY     right hand middle finger/ cut nerve  . KNEE SURGERY     left knee       Family History  Problem Relation Age of Onset  . Hypertension Mother   . Diabetes Mother   . Diabetes Sister   . CAD Neg Hx   . Cancer Neg Hx        prostate or colon    Social History   Tobacco Use  . Smoking status: Former Smoker    Types: Cigarettes    Quit date: 09/21/2011    Years since quitting: 8.1  . Smokeless tobacco: Never Used  Substance Use Topics  . Alcohol use: Yes    Alcohol/week: 2.0 standard drinks    Types: 2  Cans of beer per week  . Drug use: No    Home Medications Prior to Admission medications   Medication Sig Start Date End Date Taking? Authorizing Provider  levothyroxine (SYNTHROID) 100 MCG tablet TAKE 1 TABLET BY MOUTH EVERY DAY BEFORE BREAKFAST 09/04/19   Venia Carbon, MD    Allergies    Patient has no known allergies.  Review of Systems   Review of Systems  All other systems reviewed and are negative.   Physical Exam Updated Vital Signs BP (!) 145/95 (BP Location: Right Wrist)   Pulse 83   Temp 98.2 F (36.8 C) (Oral)   Resp 20   Ht 5\' 8"  (1.727 m)   Wt 63.5 kg   SpO2 99%   BMI 21.29 kg/m   Physical Exam Vitals and nursing note reviewed.  Constitutional:      General: He is not in acute distress.    Appearance: He is well-developed. He is not diaphoretic.  HENT:     Head: Normocephalic and atraumatic.  Cardiovascular:     Rate and Rhythm: Normal rate and regular rhythm.     Heart sounds: No murmur heard.  No friction rub.  Pulmonary:     Effort: Pulmonary effort is normal. No respiratory distress.     Breath sounds: Normal breath sounds. No wheezing or rales.  Abdominal:     General: Bowel sounds are normal. There is no distension.     Palpations: Abdomen is soft.     Tenderness: There is no abdominal tenderness.  Musculoskeletal:        General: Normal range of motion.     Cervical back: Normal range of motion and neck supple.     Comments: The right knee appears grossly normal.  There is no obvious effusion.  He does have tenderness over the medial aspect of the knee.  He has good range of motion, but with some discomfort.  There is no crepitus.  The knee is stable with anterior and posterior drawer test and there is no significant laxity with varus or valgus tension.  Skin:    General: Skin is warm and dry.  Neurological:     Mental Status: He is alert and oriented to person, place, and time.     Coordination: Coordination normal.     ED Results  / Procedures / Treatments   Labs (all labs ordered are listed, but only abnormal results are displayed) Labs Reviewed - No data to display  EKG None  Radiology No results found.  Procedures Procedures (including critical care time)  Medications Ordered in ED Medications  HYDROmorphone (DILAUDID) injection 2 mg (has no administration in time range)    ED Course  I have reviewed the triage vital signs and the nursing notes.  Pertinent labs & imaging results that were available during my care of the patient were reviewed by me and considered in my medical decision making (see chart for details).    MDM Rules/Calculators/A&P  Patient is a 59 year old male presenting with complaints of left sided pain after falling while at work.  Initial x-rays showed no evidence for rib fracture.  Due to the degree of discomfort, a CT scan was obtained of the abdomen and pelvis.  This revealed a grade 2 spleen laceration with small perisplenic hematoma.  Patient is hemodynamically stable with hemoglobin of 13.1.  These findings were discussed with Dr. Zenia Resides from trauma surgery.  As there are no available inpatient beds at [facility], patient will be transferred to the emergency department for trauma consultation and further evaluation.  I feel that it is best for him to be at [facility] where there are IR capabilities in case he begins to bleed.  CRITICAL CARE Performed by: Veryl Speak Total critical care time: 45 minutes Critical care time was exclusive of separately billable procedures and treating other patients. Critical care was necessary to treat or prevent imminent or life-threatening deterioration. Critical care was time spent personally by me on the following activities: development of treatment plan with patient and/or surrogate as well as nursing, discussions with consultants, evaluation of patient's response to treatment, examination of patient, obtaining history from patient or  surrogate, ordering and performing treatments and interventions, ordering and review of laboratory studies, ordering and review of radiographic studies, pulse oximetry and re-evaluation of patient's condition.   Final Clinical Impression(s) / ED Diagnoses Final diagnoses:  None    Rx / DC Orders ED Discharge Orders    None       Veryl Speak, MD 11/28/19 [phone]

## 2019-11-29 ENCOUNTER — Observation Stay (HOSPITAL_COMMUNITY): Payer: No Typology Code available for payment source

## 2019-11-29 DIAGNOSIS — S36039A Unspecified laceration of spleen, initial encounter: Secondary | ICD-10-CM | POA: Diagnosis present

## 2019-11-29 DIAGNOSIS — S83281A Other tear of lateral meniscus, current injury, right knee, initial encounter: Secondary | ICD-10-CM | POA: Diagnosis present

## 2019-11-29 DIAGNOSIS — M25561 Pain in right knee: Secondary | ICD-10-CM | POA: Diagnosis present

## 2019-11-29 DIAGNOSIS — E039 Hypothyroidism, unspecified: Secondary | ICD-10-CM | POA: Diagnosis present

## 2019-11-29 DIAGNOSIS — Y990 Civilian activity done for income or pay: Secondary | ICD-10-CM | POA: Diagnosis not present

## 2019-11-29 DIAGNOSIS — Z87891 Personal history of nicotine dependence: Secondary | ICD-10-CM | POA: Diagnosis not present

## 2019-11-29 DIAGNOSIS — W172XXA Fall into hole, initial encounter: Secondary | ICD-10-CM | POA: Diagnosis present

## 2019-11-29 DIAGNOSIS — S8001XA Contusion of right knee, initial encounter: Secondary | ICD-10-CM | POA: Diagnosis present

## 2019-11-29 DIAGNOSIS — Y92488 Other paved roadways as the place of occurrence of the external cause: Secondary | ICD-10-CM | POA: Diagnosis not present

## 2019-11-29 DIAGNOSIS — S36031A Moderate laceration of spleen, initial encounter: Secondary | ICD-10-CM | POA: Diagnosis present

## 2019-11-29 DIAGNOSIS — E785 Hyperlipidemia, unspecified: Secondary | ICD-10-CM | POA: Diagnosis present

## 2019-11-29 DIAGNOSIS — Z20822 Contact with and (suspected) exposure to covid-19: Secondary | ICD-10-CM | POA: Diagnosis present

## 2019-11-29 LAB — HEMOGLOBIN AND HEMATOCRIT, BLOOD
HCT: 39.8 % (ref 39.0–52.0)
HCT: 41.5 % (ref 39.0–52.0)
HCT: 40 % (ref 39.0–52.0)
HCT: 41.2 % (ref 39.0–52.0)
Hemoglobin: 12.7 g/dL — ABNORMAL LOW (ref 13.0–17.0)
Hemoglobin: 13.3 g/dL (ref 13.0–17.0)
Hemoglobin: 12.9 g/dL — ABNORMAL LOW (ref 13.0–17.0)
Hemoglobin: 13.3 g/dL (ref 13.0–17.0)

## 2019-11-29 MED ORDER — MORPHINE SULFATE (PF) 2 MG/ML IV SOLN
2.0000 mg | INTRAVENOUS | Status: DC | PRN
  Administered 2019-11-30: 2 mg via INTRAVENOUS
  Filled 2019-11-29: qty 1

## 2019-11-29 MED ORDER — ACETAMINOPHEN 500 MG PO TABS
1000.0000 mg | ORAL_TABLET | Freq: Three times a day (TID) | ORAL | Status: DC
  Administered 2019-11-29 – 2019-11-30 (×3): 1000 mg via ORAL
  Filled 2019-11-29 (×3): qty 2

## 2019-11-29 MED ORDER — POLYETHYLENE GLYCOL 3350 17 G PO PACK
17.0000 g | PACK | Freq: Every day | ORAL | Status: DC
  Administered 2019-11-29 – 2019-11-30 (×2): 17 g via ORAL
  Filled 2019-11-29 (×2): qty 1

## 2019-11-29 MED ORDER — OXYCODONE HCL 5 MG PO TABS
5.0000 mg | ORAL_TABLET | ORAL | Status: DC | PRN
  Administered 2019-11-29 – 2019-11-30 (×4): 10 mg via ORAL
  Filled 2019-11-29 (×5): qty 2

## 2019-11-29 MED ORDER — METHOCARBAMOL 500 MG PO TABS
750.0000 mg | ORAL_TABLET | Freq: Three times a day (TID) | ORAL | Status: DC | PRN
  Administered 2019-11-30: 750 mg via ORAL
  Filled 2019-11-29: qty 2

## 2019-11-29 MED ORDER — METOPROLOL TARTRATE 5 MG/5ML IV SOLN: 5.0000 mg | Freq: Three times a day (TID) | INTRAVENOUS | Status: DC | PRN

## 2019-11-29 MED ORDER — LORAZEPAM 2 MG/ML IJ SOLN
1.0000 mg | Freq: Once | INTRAMUSCULAR | Status: AC | PRN
  Administered 2019-11-29: 1 mg via INTRAVENOUS
  Filled 2019-11-29: qty 1

## 2019-11-29 NOTE — Discharge Summary (Signed)
    Patient ID: Louis Evans [id number] [birth year] 59 y.o.  Admit date: 11/28/2019 Discharge date: 11/29/2019  Admitting Diagnosis: Fall Grade 2 splenic laceration  Discharge Diagnosis Fall G2 Splenic Laceration  Right knee injury Hx HLD  Hypothyroidism  Consultants Ortho   H&P: Louis Evans is a 59 yo male who presented after a fall. 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 He also hit his right knee. He did not hit his head and denies loss of consciousness. He presented to the [facility] ED. Has been hemodynamically stable since arrival. Labs were unremarkable. Chest XR and right knee films showed no acute fractures. CT abd/pelvis showed a grade 2 splenic laceration. Trauma was consulted for further evaluation. Patient still reports LUQ/left lower chest wall pain, as well as pain on his inner right knee.  Patient has a history of hypothyroidism but no other chronic medical conditions. Prior surgeries include finger surgery on the right hand.   Procedures None   Hospital Course:  Patient was admitted to the trauma service after being found to have G2 splenic laceration. Serial hgbs were monitored and remained stable. Patient diet was advanced and tolerated. Patient also complained of right knee pain. Xrays were negative. Patient had difficulty bearing weight. Ortho was consulted and recommended MRI. This showed hematoma along MCl, possible small lateral meniscus tear and contusion of posterior lateral tibial plateau. Ortho recommended WBAT and crutches. PT evaluated and recommended crutches which were ordered.   On Saturday, Nov 13, the patient was voiding well, tolerating diet, working well with therapies, pain well controlled, vital signs stable and felt stable for discharge home. Follow up as noted below.   Physical Exam: Minimal complaints in bed.  Will walk with crutches before  leaving  Allergies as of 11/30/2019   No Known Allergies     Medication List    TAKE these medications   HYDROcodone-acetaminophen 5-325 MG tablet Commonly known as: NORCO/VICODIN Take 1 tablet by mouth every 6 (six) hours as needed for moderate pain.   levothyroxine 100 MCG tablet Commonly known as: SYNTHROID TAKE 1 TABLET BY MOUTH EVERY DAY BEFORE BREAKFAST            Durable Medical Equipment  (From admission, onward)         Start     Ordered   11/29/19 1443  For home use only DME Crutches  Once        11/29/19 1442              Follow-up Information    Venia Carbon, MD. Schedule an appointment as soon as possible for a visit.   Specialties: Internal Medicine, Pediatrics Why: for follow up Contact information: [place] [address] [phone]        [facility]. Call.   Why: As needed Contact information: [address] [address] [phone]       Shona Needles, MD. Schedule an appointment as soon as possible for a visit.   Specialty: Orthopedic Surgery Why: For right knee injury Contact information: [place] [address] [phone]                Signed: Alferd Apa, [facility] 11/29/2019, 3:27 PM Please see Amion for pager number during day hours 7:00am-4:30pm

## 2019-11-29 NOTE — Consult Note (Signed)
Reason for Consult:Right knee pain Referring Physician: B Norberto Evans is an 59 y.o. male.  HPI: Louis Evans fell at work about 3-4 feet and landed on his left side. He was brought to the ED where workup showed a splenic lac and he was admitted. He also c/o right knee pain such that he could not bear weight. X-rays were negative and MRI was obtained. This showed a tibial plateau contusion and soft tissue injury to the medial knee and orthopedic surgery was consulted. He works in [employer].  Past Medical History:  Diagnosis Date  . Allergy   . BPH (benign prostatic hypertrophy)   . History of BPH   . Hyperlipidemia    mild  . Hypothyroidism   . 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 Post-operative nausea and vomiting   . Tobacco use disorder     Past Surgical History:  Procedure Laterality Date  . FINGER SURGERY     right hand middle finger/ cut nerve  . KNEE SURGERY     left knee    Family History  Problem Relation Age of Onset  . Hypertension Mother   . Diabetes Mother   . Diabetes Sister   . CAD Neg Hx   . Cancer Neg Hx        prostate or colon    Social History:  reports that he quit smoking about 8 years ago. His smoking use included cigarettes. He has never used smokeless tobacco. He reports current alcohol use of about 2.0 standard drinks of alcohol per week. He reports that he does not use drugs.  Allergies: No Known Allergies  Medications: I have reviewed the patient's current medications.  Results for orders placed or performed during the hospital encounter of 11/28/19 (from the past 48 hour(s))  Basic metabolic panel     Status: Abnormal   Collection Time: 11/28/19  2:14 AM  Result Value Ref Range   Sodium 136 135 - 145 mmol/L   Potassium 4.4 3.5 - 5.1 mmol/L   Chloride 105 98 - 111 mmol/L   CO2 23 22 - 32 mmol/L   Glucose, Bld 113 (H) 70 - 99 mg/dL    Comment: Glucose reference range applies only to samples taken  after fasting for at least 8 hours.   BUN 15 6 - 20 mg/dL   Creatinine, Ser 1.10 0.61 - 1.24 mg/dL   Calcium 9.3 8.9 - 10.3 mg/dL   GFR, Estimated >60 >60 mL/min    Comment: (NOTE) Calculated using the CKD-EPI Creatinine Equation (2021)    Anion gap 8 5 - 15    Comment: Performed at [facility], [place] [address], [place], [place] [address]  CBC with Differential     Status: Abnormal   Collection Time: 11/28/19  2:14 AM  Result Value Ref Range   WBC 15.4 (H) 4.0 - 10.5 K/uL   RBC 5.03 4.22 - 5.81 MIL/uL   Hemoglobin 13.1 13.0 - 17.0 g/dL   HCT 40.6 39 - 52 %   MCV 80.7 80.0 - 100.0 fL   MCH 26.0 26.0 - 34.0 pg   MCHC 32.3 30.0 - 36.0 g/dL   RDW 15.1 11.5 - 15.5 %   Platelets 305 150 - 400 K/uL   nRBC 0.0 0.0 - 0.2 %   Neutrophils Relative % 78 %   Neutro Abs 12.0 (H) 1.7 - 7.7 K/uL   Lymphocytes Relative 11 %   Lymphs Abs  1.7 0.7 - 4.0 K/uL   Monocytes Relative 9 %   Monocytes Absolute 1.4 (H) 0.1 - 1.0 K/uL   Eosinophils Relative 1 %   Eosinophils Absolute 0.1 0.0 - 0.5 K/uL   Basophils Relative 1 %   Basophils Absolute 0.1 0.0 - 0.1 K/uL   Immature Granulocytes 0 %   Abs Immature Granulocytes 0.06 0.00 - 0.07 K/uL    Comment: Performed at [facility], [place] [address] [place], [place] [address]  Hepatic function panel     Status: None   Collection Time: 11/28/19  2:14 AM  Result Value Ref Range   Total Protein 7.1 6.5 - 8.1 g/dL   Albumin 4.0 3.5 - 5.0 g/dL   AST 26 15 - 41 U/L   ALT 25 0 - 44 U/L   Alkaline Phosphatase 54 38 - 126 U/L   Total Bilirubin 0.9 0.3 - 1.2 mg/dL   Bilirubin, Direct 0.1 0.0 - 0.2 mg/dL   Indirect Bilirubin 0.8 0.3 - 0.9 mg/dL    Comment: Performed at [facility], [place] [address], [place], [place] [address]  Respiratory Panel by RT PCR (Flu A&Louis, Covid) - Nasopharyngeal Swab     Status: None   Collection Time: 11/28/19  4:18 AM   Specimen: Nasopharyngeal Swab  Result Value Ref Range    SARS Coronavirus 2 by RT PCR NEGATIVE NEGATIVE    Comment: (NOTE) SARS-CoV-2 target nucleic acids are NOT DETECTED.  The SARS-CoV-2 RNA is generally detectable in upper respiratoy specimens during the acute phase of infection. The lowest concentration of SARS-CoV-2 viral copies this assay can detect is 131 copies/mL. A negative result does not preclude SARS-Cov-2 infection and should not be used as the sole basis for treatment or other patient management decisions. A negative result may occur with  improper specimen collection/handling, submission of specimen other than nasopharyngeal swab, presence of viral mutation(s) within the areas targeted by this assay, and inadequate number of viral copies (<131 copies/mL). A negative result must be combined with clinical observations, patient history, and epidemiological information. The expected result is Negative.  Fact Sheet for Patients:  PinkCheek.be  Fact Sheet for Healthcare Providers:  GravelBags.it  This test is no t yet approved or cleared by the [place] FDA and  has been authorized for detection and/or diagnosis of SARS-CoV-2 by FDA under an Emergency Use Authorization (EUA). This EUA will remain  in effect (meaning this test can be used) for the duration of the COVID-19 declaration under Section 564(Louis)(1) of the Act, 21 U.S.C. section 360bbb-3(Louis)(1), unless the authorization is terminated or revoked sooner.     Influenza A by PCR NEGATIVE NEGATIVE   Influenza Louis by PCR NEGATIVE NEGATIVE    Comment: (NOTE) The Xpert Xpress SARS-CoV-2/FLU/RSV assay is intended as an aid in  the diagnosis of influenza from Nasopharyngeal swab specimens and  should not be used as a sole basis for treatment. Nasal washings and  aspirates are unacceptable for Xpert Xpress SARS-CoV-2/FLU/RSV  testing.  Fact Sheet for Patients: PinkCheek.be  Fact Sheet  for Healthcare Providers: GravelBags.it  This test is not yet approved or cleared by the [place] FDA and  has been authorized for detection and/or diagnosis of SARS-CoV-2 by  FDA under an Emergency Use Authorization (EUA). This EUA will remain  in effect (meaning this test can be used) for the duration of the  Covid-19 declaration under Section 564(Louis)(1) of the Act, 21  U.S.C. section 360bbb-3(Louis)(1), unless the authorization  is  terminated or revoked. Performed at [facility], [place] [address], [place], [place] [address]   Hemoglobin and hematocrit, blood     Status: Abnormal   Collection Time: 11/28/19 11:57 AM  Result Value Ref Range   Hemoglobin 12.9 (L) 13.0 - 17.0 g/dL   HCT 40.6 39 - 52 %    Comment: Performed at [place] [address] [place], [place] [address]  Glucose, capillary     Status: Abnormal   Collection Time: 11/28/19  7:49 PM  Result Value Ref Range   Glucose-Capillary 101 (H) 70 - 99 mg/dL    Comment: Glucose reference range applies only to samples taken after fasting for at least 8 hours.  Glucose, capillary     Status: None   Collection Time: 11/28/19 11:35 PM  Result Value Ref Range   Glucose-Capillary 94 70 - 99 mg/dL    Comment: Glucose reference range applies only to samples taken after fasting for at least 8 hours.  Hemoglobin and hematocrit, blood     Status: Abnormal   Collection Time: 11/29/19  1:52 AM  Result Value Ref Range   Hemoglobin 12.7 (L) 13.0 - 17.0 g/dL   HCT 39.8 39 - 52 %    Comment: Performed at [place] [address], [place], [place] [address]  Hemoglobin and hematocrit, blood     Status: None   Collection Time: 11/29/19 10:53 AM  Result Value Ref Range   Hemoglobin 13.3 13.0 - 17.0 g/dL   HCT 41.5 39 - 52 %    Comment: Performed at [place] [address], [place], [place] [address]    DG Ribs Unilateral W/Chest Left  Result Date:  11/28/2019 CLINICAL DATA:  Fall, left rib pain EXAM: LEFT RIBS AND CHEST - 3+ VIEW COMPARISON:  None. FINDINGS: No fracture or other bone lesions are seen involving the ribs. There is no evidence of pneumothorax or pleural effusion. Both lungs are clear. Heart size and mediastinal contours are within normal limits. IMPRESSION: Negative. Electronically Signed   By: Fidela Salisbury MD   On: 11/28/2019 01:12   CT ABDOMEN PELVIS W CONTRAST  Addendum Date: 11/28/2019   ADDENDUM REPORT: 11/28/2019 03:54 ADDENDUM: These results were called by telephone at the time of interpretation on 11/28/2019 at 3:52 am to provider Veryl Speak , who verbally acknowledged these results. Electronically Signed   By: Fidela Salisbury MD   On: 11/28/2019 03:54   Result Date: 11/28/2019 CLINICAL DATA:  Abdominal trauma, left rib pain, left flank pain EXAM: CT ABDOMEN AND PELVIS WITH CONTRAST TECHNIQUE: Multidetector CT imaging of the abdomen and pelvis was performed using the standard protocol following bolus administration of intravenous contrast. CONTRAST:  161mL OMNIPAQUE IOHEXOL 300 MG/ML  SOLN COMPARISON:  None. FINDINGS: Lower chest: Mild bibasilar atelectasis. The visualized lung bases are otherwise clear. The visualized heart and pericardium are unremarkable. Hepatobiliary: No focal liver abnormality is seen. No gallstones, gallbladder wall thickening, or biliary dilatation. Pancreas: Unremarkable Spleen: There is a focal laceration involving the inferior aspect of the spleen with a laceration depth of approximately 2 cm. No active extravasation. Tiny perihepatic hematoma. This is compatible with an AAST grade 2 splenic injury. Adrenals/Urinary Tract: Adrenal glands are unremarkable. Tiny cortical cysts are seen within the kidneys bilaterally. No enhancing renal masses. The kidneys are otherwise unremarkable. Bladder unremarkable. Stomach/Bowel: Stomach is within normal limits. Appendix appears normal. No evidence of bowel wall  thickening, distention,  or inflammatory changes. No free intraperitoneal gas. Trace high attenuation free fluid within the pelvis within the peritoneal reflection anterior to the rectum in keeping with trace hemoperitoneum. Vascular/Lymphatic: Mild aortoiliac atherosclerotic calcification. No aortic aneurysm. The abdominal vasculature is otherwise unremarkable. Specifically, the splenic vein is patent. Reproductive: Prostate is unremarkable. Other: Rectum unremarkable Musculoskeletal: No acute bone abnormality. IMPRESSION: AAST grade 2 splenic injury with small laceration and tiny perihepatic hematoma. No active extravasation. Attempts are being made at this time to contact the managing physician for direct communication. Electronically Signed: By: Fidela Salisbury MD On: 11/28/2019 03:51   MR KNEE RIGHT WO CONTRAST  Result Date: 11/29/2019 CLINICAL DATA:  [place] approximately 3-4 feet into a hole. Landed on the left side. EXAM: MRI OF THE RIGHT KNEE WITHOUT CONTRAST TECHNIQUE: Multiplanar, multisequence MR imaging of the knee was performed. No intravenous contrast was administered. COMPARISON:  None. FINDINGS: MENISCI Medial: Intact. Lateral: Irregularity of the posterior horn of the lateral meniscus (image 10/series 14) which may be artifactual versus a small tear along the superior surface. LIGAMENTS Cruciates: ACL and PCL are intact. Collaterals: Medial collateral ligament is intact. Lateral collateral ligament complex is intact. Moderate amount of fluid along the superficial aspect of the MCL concerning for a liquified hematoma or MCL bursitis. CARTILAGE Patellofemoral:  No chondral defect. Medial:  No chondral defect. Lateral: Mild cartilage irregularity of the weight-bearing surface of the lateral femoral condyle. No chondral defect. JOINT: No joint effusion. Normal Hoffa's fat-pad. No plical thickening. POPLITEAL FOSSA: Popliteus tendon is intact. Small ruptured Baker's cyst. EXTENSOR MECHANISM: Intact  quadriceps tendon. Intact patellar tendon. Intact lateral patellar retinaculum. Intact medial patellar retinaculum. Intact MPFL. BONES: No aggressive osseous lesion. No fracture or dislocation. Mild bone marrow edema in the posterolateral tibial plateau likely reflecting an osseous contusion. Other: No fluid collection or hematoma. Muscles are normal. IMPRESSION: 1. Moderate amount of fluid along the superficial aspect of the MCL concerning for a liquified hematoma or MCL bursitis. MCL is intact. 2. Irregularity of the posterior horn of the lateral meniscus (image 10/series 14) which may be artifactual versus a small tear along the superior surface. 3. Mild osseous contusion of the posterolateral tibial plateau. 4. No meniscal or ligamentous injury of right knee. Electronically Signed   By: Kathreen Devoid   On: 11/29/2019 13:34   DG Knee Complete 4 Views Right  Result Date: 11/28/2019 CLINICAL DATA:  Fall, right knee pain EXAM: RIGHT KNEE - COMPLETE 4+ VIEW COMPARISON:  None. FINDINGS: Four view radiograph right knee demonstrates normal alignment. No fracture or dislocation. Medial and lateral compartment joint spaces are preserved. A punctate 2 mm metallic foreign body is seen within the medial subcutaneous soft tissues at the level of the upper pole of the patella. No effusion. IMPRESSION: 2 mm metallic retained foreign body within the medial soft tissues. No acute fracture or dislocation. Electronically Signed   By: Fidela Salisbury MD   On: 11/28/2019 01:11    Review of Systems  HENT: Negative for ear discharge, ear pain, hearing loss and tinnitus.   Eyes: Negative for photophobia and pain.  Respiratory: Negative for cough and shortness of breath.   Cardiovascular: Positive for chest pain.  Gastrointestinal: Negative for abdominal pain, nausea and vomiting.  Genitourinary: Negative for dysuria, flank pain, frequency and urgency.  Musculoskeletal: Positive for arthralgias (Right knee). Negative for  back pain, myalgias and neck pain.  Neurological: Negative for dizziness and headaches.  Hematological: Does not bruise/bleed easily.  Psychiatric/Behavioral: The patient  is not nervous/anxious.    Blood pressure 96/63, pulse 63, temperature 98 F (36.7 C), temperature source Oral, resp. rate 18, height 5\' 8"  (1.727 m), weight 63.5 kg, SpO2 95 %. Physical Exam Constitutional:      General: He is not in acute distress.    Appearance: He is well-developed. He is not diaphoretic.  HENT:     Head: Normocephalic and atraumatic.  Eyes:     General: No scleral icterus.       Right eye: No discharge.        Left eye: No discharge.     Conjunctiva/sclera: Conjunctivae normal.  Cardiovascular:     Rate and Rhythm: Normal rate and regular rhythm.  Pulmonary:     Effort: Pulmonary effort is normal. No respiratory distress.  Musculoskeletal:     Cervical back: Normal range of motion.     Comments: LLE No traumatic wounds, ecchymosis, or rash  TTP medial knee, slight erythema  No knee or ankle effusion  Knee stable to varus/ valgus and anterior/posterior stress  Sens DPN, SPN, TN intact  Motor EHL, ext, flex, evers 5/5  DP 2+, PT 2+, No significant edema  Skin:    General: Skin is warm and dry.  Neurological:     Mental Status: He is alert.  Psychiatric:        Behavior: Behavior normal.     Assessment/Plan: Right knee injury -- This should heal with time. I will provide crutches for comfort. He may be WBAT once his pain allows. F/u with Dr. Doreatha Martin prn.    Lisette Abu, PA-C Orthopedic Surgery [phone] 11/29/2019, 2:38 PM

## 2019-11-29 NOTE — Discharge Instructions (Signed)
Splenic Injury  A splenic injury is an injury of the spleen. The spleen is an organ located in the upper left area of the abdomen, just under the ribs. The spleen filters and cleans the blood. It also stores blood cells and destroys cells that are worn out. The spleen also plays an important role in fighting disease. Splenic injuries can vary. In some cases, the spleen may only be bruised, with some bleeding inside the covering and around the spleen. Splenic injuries may also cause a deep tear or cut into the spleen (lacerated spleen). Some splenic injuries can cause the spleen to break open (rupture). What are the causes? This condition may be caused by a direct blow (trauma) to the spleen. Trauma can result from:  Car accidents.  Contact sports.  Falls.  Penetrating injuries. These can be caused by gunshot wounds or sharp objects such as a knife. What increases the risk? You may be at greater risk for a splenic injury if you have a disease that can cause the spleen to become enlarged. These include:  Alcoholic liver disease.  Viral infections, especially mononucleosis.  Certain inflammatory diseases, such as lupus.  Certain cancers, especially those that involve the lymphatic system.  Cystic fibrosis. What are the signs or symptoms? Symptoms of this condition depend on the severity of the injury. A minor injury often causes no symptoms or only minor pain in the abdomen. A major injury can result in severe bleeding, causing your blood pressure to decrease rapidly. This in turn will cause symptoms such as:  Dizziness or light-headedness.  Rapid heart rate.  Difficulty breathing.  Fainting.  Sweating with clammy skin. Other symptoms of a splenic injury may include:  Very bad abdominal pain.  Pain in the left shoulder.  Pain when the abdomen is pressed (tenderness).  Nausea.  Swelling or bruising of the abdomen. How is this diagnosed? This condition may be diagnosed  based on:  Your symptoms and medical history, especially if you were recently in an accident or you recently got hurt.  A physical exam.  Imaging tests, such as: ? CT scan. ? Ultrasound. You may have frequent blood tests for a few days after the injury to monitor your condition. How is this treated? Treatment depends on the type of splenic injury you have and how bad it is.  Less severe injuries may be treated with: ? Observation. ? Interventional radiology. This involves using flexible tubes (catheters) to stop the bleeding from inside the blood vessel.  More severe injuries may require hospitalization in the intensive care unit (ICU). While you are in the hospital: ? Your fluid and blood levels will be monitored closely. ? You will get fluids through an IV as needed. ? You may need follow-up scans to check whether your spleen is able to heal itself. If the injury is getting worse, you may need surgery. ? You may receive donated blood (transfusion). ? You may have a long needle inserted into your abdomen to remove any blood that has collected inside the spleen (hematoma).  If your blood pressure is too low, you may need emergency surgery. This may include: ? Repairing a laceration. ? Removing part of the spleen. ? Removing the entire spleen (splenectomy). Follow these instructions at home: Activity  Rest as told by your health care provider.  Avoid sitting for a long time without moving. Get up to take short walks every 1-2 hours. This is important to improve blood flow and breathing. Ask for help   if you feel weak or unsteady.  Do not participate in any activity that takes a lot of effort until your health care provider says that it is safe.  Do not take part in contact sports or any activity that could lead to abdominal trauma for the next 6 weeks or until your health care provider says it is safe to do so.  Do not lift anything that is heavier than 10 lb (4.5 kg) for 6 weeks  or until your health care provider says that it is safe. General instructions  Take over-the-counter and prescription medicines only as told by your health care provider.  Stay up-to-date on vaccines as told by your health care provider.  Follow instructions from your health care provider about eating or drinking restrictions.  Do not drink alcohol.  Do not use any products that contain nicotine or tobacco, such as cigarettes and e-cigarettes. These may delay healing after an injury. If you need help quitting, ask your health care provider.  Keep all follow-up visits as told by your health care provider. This is important. Get help right away if you have:  A fever.  New or increasing pain in your abdomen or in your left shoulder.  Signs or symptoms of internal bleeding. Watch for: ? Sweating. ? Dizziness. ? Weakness. ? Cold and clammy skin. ? Fainting.  Chest pain or difficulty breathing. Summary  The spleen is an organ located in the upper left area of the abdomen, just under the ribs.  A splenic injury is an injury of the spleen. This may be caused by a direct blow (trauma) to the spleen.  You may be at greater risk for a splenic injury if you have a disease that can cause the spleen to become enlarged.  A minor injury often causes no symptoms or only minor pain in the abdomen. A major injury can result in severe bleeding, causing your blood pressure to decrease rapidly.  Treatment depends on the type of splenic injury you have and how bad it is. This information is not intended to replace advice given to you by your health care provider. Make sure you discuss any questions you have with your health care provider. Document Revised: 03/17/2017 Document Reviewed: 12/28/2016 Elsevier Patient Education  [place] Use, Adult Crutches are used to take weight off of one of your legs or feet when you stand or walk. You may need crutches to help you heal after  an injury or procedure. It is important to use crutches that fit properly. When fitted properly:  Each crutch should be 2-3 finger widths below the armpit.  Your weight should be supported by your hand and not by resting your armpit on the crutch. It is important that a health care provider has seen you use crutches effectively before you use them at home. What are the risks? Improper use of crutches can injure your shoulders, arms, back, armpits, wrists, and hands. To prevent this from happening, make sure your crutches fit properly, and do not put pressure on your armpits when using the crutches. While using crutches, you also have a higher risk of falling. To prevent falls while using crutches at home or work:  Move furniture or barriers that are in your walkway when possible. Have someone help you with this as needed.  Remove rugs, cords, and other items that you can trip on. Have someone help you with this as needed.  Keep walkways well lit.  Use a  backpack so you do not need to carry items in your hands. How to use your crutches How you use your crutches will depend on the reason you need them. Your health care provider may tell you not to put any weight on the affected leg (non-weight-bearing). Or your health care provider may allow you to put some, but not all, weight on the affected leg (partial weight-bearing). Follow instructions from your health care provider about weight-bearing. Do not bear weight in an amount that causes pain to the affected area. Walking  1. Stand on your healthy leg and lift both crutches at the same time. 2. Place the crutches one step-length in front of you. Keep your weight over the hand grips. 3. Bring your healthy leg forward to meet, or land slightly ahead of, the crutches. 4. Repeat. Going up steps If there is no handrail: 1. Walk up to steps and put weight on hand grips to step up. 2. Step up with the healthy leg. 3. Step up with the crutches and  injured leg. 4. Repeat. If there is a handrail: 1. Hold both crutches in one hand. 2. Place your other hand on the handrail. 3. Place your weight on your arms and step up with your healthy leg. 4. Bring the crutches and the injured leg up to that step. 5. Continue in this way. If you feel unsteady on steps, you can go up steps on your buttocks. To go up, sit on the lowest step with your injured leg in front and holding both crutches flat against the stairs in one hand. Then use your free hand and your healthy leg for support to scoot your buttocks up to the next step. Going down steps If there is no handrail: 1. Step down with the injured leg and crutches. Make sure to keep the crutch tips in the center of the step, not close to the front edge. 2. Step down with the healthy leg. 3. Repeat. If there is a handrail: 1. Place one hand on the handrail. 2. Hold both crutches with your free hand. 3. Lower your injured leg and crutches to the step below you. Make sure to keep the crutch tips in the center of the step, not close to the front edge. 4. Lower your healthy leg down to the next step. 5. Repeat. If you feel unsteady on steps, you can go down steps on your buttocks. To go down, sit on the highest step with your injured leg in front and holding both crutches flat against the stairs in the other hand. Then use your free hand and your healthy leg for support to scoot your buttocks down to the next step. Standing up  Move to the edge of the seat. If there is an armrest: 1. Hold the injured leg forward. 2. Grab the armrest with one hand and the top of the crutches with the other hand. 3. Using the armrest and your crutches, pull yourself up to a standing position. If there is no armrest: 1. Hold the injured leg forward. 2. Hold on to the seat with one hand and the top of the crutches with the other hand. 3. Using the seat and your crutches, bring yourself up to a standing  position. Sitting down  Move back until your leg touches the edge of the seat. If there is an armrest: 1. Hold the injured leg forward. 2. Grab the armrest with one hand and the top of the crutches with the other hand. 3.  Slowly lower yourself to a sitting position. If there is no armrest: 1. Hold the injured leg forward. 2. Reach for and hold on to the seat with one hand and hold on to the top of the crutches with the other hand. 3. Slowly lower yourself to a sitting position. Contact a health care provider if:  You feel unsteady using crutches.  You develop any new pain.  You develop any numbness or tingling.  Your crutches do not fit. Get help right away if:  You fall. Summary  Crutches are used when you need to take weight off of one of your legs or feet to stand or walk.  To prevent injury, make sure your crutches fit properly, and do not put pressure on your armpits when using the crutches.  Follow instructions from your health care provider about weight-bearing. This information is not intended to replace advice given to you by your health care provider. Make sure you discuss any questions you have with your health care provider. Document Revised: 07/25/2018 Document Reviewed: 07/25/2018 Elsevier Patient Education  [place].

## 2019-11-29 NOTE — Progress Notes (Addendum)
Subjective: CC: Patient complains of pain over his right knee. Notes that he has only been able to bear a small amount of weight on the right side when he walks to the bathroom.   No abdominal pain, n/v. Eating breakfast and tolerating diet. Passing flatus. No BM. No other areas of pain. Voiding without difficulty.   Objective: Vital signs in last 24 hours: Temp:  [98.3 F (36.8 C)-99.2 F (37.3 C)] 99.2 F (37.3 C) (11/12 0558) Pulse Rate:  [55-77] 60 (11/12 0558) Resp:  [16-18] 16 (11/12 0558) BP: (97-124)/(66-100) 98/66 (11/12 0558) SpO2:  [94 %-100 %] 94 % (11/12 0558)    Intake/Output from previous day: 11/11 0701 - 11/12 0700 In: 480 [P.O.:480] Out: -  Intake/Output this shift: No intake/output data recorded.  PE: Gen:  Alert, NAD, pleasant Card:  RRR Pulm:  CTAB, no W/R/R, effort normal Abd: Soft, NT/ND, +BS, some tenderness over the left flank Ext:  Moves BUE and LLE without pain. No LE edema. DP 2+. Right knee: There is edema/swelling on the medial aspect of the right knee. No erythema, heat, fluctuance or break of the skin. TTP over medial joint line and medial aspect of the knee. Patient able to actively flex knee to ~90 and with nearly full extension against gravity. Notes pain with maximal flexion > extension. No TTP of hips or ankles. Compartments soft. Neurovascularly intact distally. Psych: A&Ox3 Skin: no rashes noted, warm and dry   Lab Results:  Recent Labs    11/28/19 0214 11/28/19 1157 11/29/19 0152 11/29/19 1053  WBC 15.4*  --   --   --   HGB 13.1   < > 12.7* 13.3  HCT 40.6   < > 39.8 41.5  PLT 305  --   --   --    < > = values in this interval not displayed.   BMET Recent Labs    11/28/19 0214  NA 136  K 4.4  CL 105  CO2 23  GLUCOSE 113*  BUN 15  CREATININE 1.10  CALCIUM 9.3   PT/INR No results for input(s): LABPROT, INR in the last 72 hours. CMP     Component Value Date/Time   NA 136 11/28/2019 0214   K 4.4 11/28/2019  0214   CL 105 11/28/2019 0214   CO2 23 11/28/2019 0214   GLUCOSE 113 (H) 11/28/2019 0214   BUN 15 11/28/2019 0214   CREATININE 1.10 11/28/2019 0214   CALCIUM 9.3 11/28/2019 0214   PROT 7.1 11/28/2019 0214   ALBUMIN 4.0 11/28/2019 0214   AST 26 11/28/2019 0214   ALT 25 11/28/2019 0214   ALKPHOS 54 11/28/2019 0214   BILITOT 0.9 11/28/2019 0214   GFRNONAA >60 11/28/2019 0214   GFRAA 103 02/04/2008 0833   Lipase  No results found for: LIPASE     Studies/Results: DG Ribs Unilateral W/Chest Left  Result Date: 11/28/2019 CLINICAL DATA:  Fall, left rib pain EXAM: LEFT RIBS AND CHEST - 3+ VIEW COMPARISON:  None. FINDINGS: No fracture or other bone lesions are seen involving the ribs. There is no evidence of pneumothorax or pleural effusion. Both lungs are clear. Heart size and mediastinal contours are within normal limits. IMPRESSION: Negative. Electronically Signed   By: Fidela Salisbury MD   On: 11/28/2019 01:12   CT ABDOMEN PELVIS W CONTRAST  Addendum Date: 11/28/2019   ADDENDUM REPORT: 11/28/2019 03:54 ADDENDUM: These results were called by telephone at the time of interpretation on 11/28/2019  at 3:52 am to provider Veryl Speak , who verbally acknowledged these results. Electronically Signed   By: Fidela Salisbury MD   On: 11/28/2019 03:54   Result Date: 11/28/2019 CLINICAL DATA:  Abdominal trauma, left rib pain, left flank pain EXAM: CT ABDOMEN AND PELVIS WITH CONTRAST TECHNIQUE: Multidetector CT imaging of the abdomen and pelvis was performed using the standard protocol following bolus administration of intravenous contrast. CONTRAST:  159mL OMNIPAQUE IOHEXOL 300 MG/ML  SOLN COMPARISON:  None. FINDINGS: Lower chest: Mild bibasilar atelectasis. The visualized lung bases are otherwise clear. The visualized heart and pericardium are unremarkable. Hepatobiliary: No focal liver abnormality is seen. No gallstones, gallbladder wall thickening, or biliary dilatation. Pancreas: Unremarkable  Spleen: There is a focal laceration involving the inferior aspect of the spleen with a laceration depth of approximately 2 cm. No active extravasation. Tiny perihepatic hematoma. This is compatible with an AAST grade 2 splenic injury. Adrenals/Urinary Tract: Adrenal glands are unremarkable. Tiny cortical cysts are seen within the kidneys bilaterally. No enhancing renal masses. The kidneys are otherwise unremarkable. Bladder unremarkable. Stomach/Bowel: Stomach is within normal limits. Appendix appears normal. No evidence of bowel wall thickening, distention, or inflammatory changes. No free intraperitoneal gas. Trace high attenuation free fluid within the pelvis within the peritoneal reflection anterior to the rectum in keeping with trace hemoperitoneum. Vascular/Lymphatic: Mild aortoiliac atherosclerotic calcification. No aortic aneurysm. The abdominal vasculature is otherwise unremarkable. Specifically, the splenic vein is patent. Reproductive: Prostate is unremarkable. Other: Rectum unremarkable Musculoskeletal: No acute bone abnormality. IMPRESSION: AAST grade 2 splenic injury with small laceration and tiny perihepatic hematoma. No active extravasation. Attempts are being made at this time to contact the managing physician for direct communication. Electronically Signed: By: Fidela Salisbury MD On: 11/28/2019 03:51   DG Knee Complete 4 Views Right  Result Date: 11/28/2019 CLINICAL DATA:  Fall, right knee pain EXAM: RIGHT KNEE - COMPLETE 4+ VIEW COMPARISON:  None. FINDINGS: Four view radiograph right knee demonstrates normal alignment. No fracture or dislocation. Medial and lateral compartment joint spaces are preserved. A punctate 2 mm metallic foreign body is seen within the medial subcutaneous soft tissues at the level of the upper pole of the patella. No effusion. IMPRESSION: 2 mm metallic retained foreign body within the medial soft tissues. No acute fracture or dislocation. Electronically Signed   By:  Fidela Salisbury MD   On: 11/28/2019 01:11    Anti-infectives: Anti-infectives (From admission, onward)   None       Assessment/Plan S/p Fall G2 splenic laceration - hgb stable at 12.7. No tachycardia or hypotension. Tolerating a regular diet. AM labs  Right knee pain - xrays negative. Having difficulty bearing weight. Discussed with Ortho. MRI ordered. PT eval.  Hx HLD  Hypothyroidism - home meds  FEN - Reg VTE - SCDs, chemical prophylaxis on hold for splenic lac  ID - None Dispo - MRI right knee. PT   LOS: 0 days    Jillyn Ledger , [facility] 11/29/2019, 11:25 AM Please see Amion for pager number during day hours 7:00am-4:30pm

## 2019-11-29 NOTE — TOC CAGE-AID Note (Signed)
Transition of Care [facility]) - CAGE-AID Screening   Patient Details  Name: Louis Evans MRN: [id number] Date of Birth: Sep 17, 1960  Transition of Care [facility]) CM/SW Contact:    Emeterio Reeve, [place] Phone Number: 11/29/2019, 3:51 PM   Clinical Narrative:  CSW met with pt at bedside. CSW introduced self and explained her role at the hospital.  Pt reports 3-4 beers on the weekend. Pt denies substance use. Pt declined resources.   CAGE-AID Screening:    Have You Ever Felt You Ought to Cut Down on Your Drinking or Drug Use?: No Have People Annoyed You By Critizing Your Drinking Or Drug Use?: No Have You Felt Bad Or Guilty About Your Drinking Or Drug Use?: No Have You Ever Had a Drink or Used Drugs First Thing In The Morning to Steady Your Nerves or to Get Rid of a Hangover?: No CAGE-AID Score: 0  Substance Abuse Education Offered: Yes    Blima Ledger, [place] Social Worker [phone]

## 2019-11-29 NOTE — Evaluation (Signed)
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  He was admitted with a grade 2 splenic laceration and R knee injury.  MRI obtained of R knee that showed tibial plateau contusion and soft tissue injury - ortho recommended crutches and WBAT.  Clinical Impression  Pt admitted with above diagnosis.  He was able to demonstrate mobility safely with and without crutches.  Recommend crutches for pain management (notified RN who was calling ortho tech).  Pt is very mobile and steady on his feet.  He is familiar with crutches from a past injury.  No further acute PT indicated    Follow Up Recommendations No PT follow up    Equipment Recommendations  Crutches    Recommendations for Other Services       Precautions / Restrictions Precautions Precautions: None Restrictions Weight Bearing Restrictions: Yes RLE Weight Bearing: Weight bearing as tolerated      Mobility  Bed Mobility Overal bed mobility: Independent                  Transfers Overall transfer level: Modified independent Equipment used: None             General transfer comment: Pt required increased time; has been ambulating back and forth to bathroom  Ambulation/Gait Ambulation/Gait assistance: Supervision Gait Distance (Feet): 60 Feet Assistive device: Rolling walker (2 wheeled);None;Crutches Gait Pattern/deviations: Decreased stance time - right;Antalgic;Step-through pattern Gait velocity: decreased   General Gait Details: Pt was steady on his feet. He was able to ambulate without AD but antalgic pattern.  He did not like RW.  Tried with 2 crutches but pain under L arm; tried 1 crutch under R arm but pt ultimately decided he preferred both crutches.  Pt is very mobile and demonstrates safe mobility with all AD and  without but was painful.  Stairs Stairs:  (Discussed stair technique - pt confident in his ability to perform stairs as he managed with crutches in past with NWB status and he is now WBAT; declined trying at this time.)          Wheelchair Mobility    Modified Rankin (Stroke Patients Only)       Balance Overall balance assessment: Independent                                           Pertinent Vitals/Pain Pain Assessment: 0-10 Pain Score:  (1/10 rest; 10/10 movement and coughing) Pain Location: L flank; R knee (flank worse than knee) Pain Descriptors / Indicators: Sharp Pain Intervention(s): Limited activity within patient's tolerance;Monitored during session    Home Living Family/patient expects to be discharged to:: Private residence Living Arrangements: Spouse/significant other Available Help at Discharge: Family;Available 24 hours/day Type of Home: House Home Access: Stairs to enter   [employer] of Steps: 2 (onto patio and then into house) Home Layout: Two level;Bed/bath upstairs;1/2 bath on main level;Other (Comment) (reports can stay downstairs on couch/recliner) Home Equipment: None      Prior Function Level of Independence: Independent         Comments: 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  He also report injury to ankle in past and has used crutches  Hand Dominance        Extremity/Trunk Assessment   Upper Extremity Assessment Upper Extremity Assessment: Overall WFL for tasks assessed    Lower Extremity Assessment Lower Extremity Assessment: RLE deficits/detail RLE Deficits / Details: R knee with 0 to 90 degrees ROM; did not MMT due to pain    Cervical / Trunk Assessment Cervical / Trunk Assessment: Normal  Communication   Communication: HOH  Cognition Arousal/Alertness: Awake/alert Behavior During Therapy: WFL for tasks assessed/performed Overall Cognitive Status: Within Functional Limits for  tasks assessed                                        General Comments   Pt educated on importance of taking deep breathing despite L flank pain to prevent PNE.  Pt also educated on gentle AROM exercises for R knee and quad sets allowing pain to be his guide.  Discussed f/u with ortho at d/c as needed.  Pt is very motivated and active - cautioned to not overdo activity.  Discussed important to be up and moving but to ask for help as needed.    Exercises     Assessment/Plan    PT Assessment Patent does not need any further PT services  PT Problem List         PT Treatment Interventions      PT Goals (Current goals can be found in the Care Plan section)  Acute Rehab PT Goals Patient Stated Goal: decrease pain PT Goal Formulation: All assessment and education complete, DC therapy    Frequency     Barriers to discharge        Co-evaluation               AM-PAC PT "6 Clicks" Mobility  Outcome Measure Help needed turning from your back to your side while in a flat bed without using bedrails?: None Help needed moving from lying on your back to sitting on the side of a flat bed without using bedrails?: None Help needed moving to and from a bed to a chair (including a wheelchair)?: None Help needed standing up from a chair using your arms (e.g., wheelchair or bedside chair)?: None Help needed to walk in hospital room?: None Help needed climbing 3-5 steps with a railing? : None 6 Click Score: 24    End of Session   Activity Tolerance: Patient tolerated treatment well Patient left: in bed;with call bell/phone within reach Nurse Communication: Mobility status      Time: 0454-0981 PT Time Calculation (min) (ACUTE ONLY): 26 min   Charges:   PT Evaluation $PT Eval Low Complexity: 1 Low PT Treatments $Therapeutic Activity: 8-22 mins       [name], PT Acute Rehab Services Pager [phone] [name] [phone]    [name] 11/29/2019, 5:18 PM

## 2019-11-30 LAB — CBC
HCT: 40.5 % (ref 39.0–52.0)
Hemoglobin: 12.9 g/dL — ABNORMAL LOW (ref 13.0–17.0)
MCH: 25.5 pg — ABNORMAL LOW (ref 26.0–34.0)
MCHC: 31.9 g/dL (ref 30.0–36.0)
MCV: 80.2 fL (ref 80.0–100.0)
Platelets: 275 10*3/uL (ref 150–400)
RBC: 5.05 MIL/uL (ref 4.22–5.81)
RDW: 14.9 % (ref 11.5–15.5)
WBC: 9.1 10*3/uL (ref 4.0–10.5)
nRBC: 0 % (ref 0.0–0.2)

## 2019-11-30 MED ORDER — HYDROCODONE-ACETAMINOPHEN 5-325 MG PO TABS: 1.0000 | ORAL_TABLET | Freq: Four times a day (QID) | ORAL | 0 refills | Status: DC | PRN

## 2019-11-30 NOTE — Progress Notes (Signed)
Orthopedic Tech Progress Note Patient Details:  Louis Evans 15-Apr-1960 [id number]  Ortho Devices Type of Ortho Device: Crutches Ortho Device/Splint Interventions: Adjustment, Application, Ordered   Post Interventions Patient Tolerated: Well   [name] 11/30/2019, 1:35 PM

## 2019-11-30 NOTE — Progress Notes (Signed)
Work note written by PA given to pt at discharge

## 2019-12-02 ENCOUNTER — Telehealth: Payer: Self-pay

## 2019-12-02 NOTE — Telephone Encounter (Signed)
He probably just needs follow up with surgeon and a follow up CT scan.

## 2019-12-02 NOTE — Telephone Encounter (Signed)
Transition Care Management Follow-up Telephone Call  Date of discharge and from where: 11/30/2019, [facility]  How have you been since you were released from the hospital? Patient states that he is doing better just still in a lot of pain.   Any questions or concerns? No  Items Reviewed:  Did the pt receive and understand the discharge instructions provided? Yes   Medications obtained and verified? Yes   Other? No   Any new allergies since your discharge? No   Dietary orders reviewed? Yes  Do you have support at home? Yes   Home Care and Equipment/Supplies: Were home health services ordered? no If so, what is the name of the agency? N/A  Has the agency set up a time to come to the patient's home? not applicable Were any new equipment or medical supplies ordered?  No What is the name of the medical supply agency? N/A Were you able to get the supplies/equipment? not applicable Do you have any questions related to the use of the equipment or supplies? No  Functional Questionnaire: (I = Independent and D = Dependent) ADLs: I  Bathing/Dressing- I  Meal Prep- I  Eating- I  Maintaining continence- I  Transferring/Ambulation- I  Managing Meds- I  Follow up appointments reviewed:   PCP Hospital f/u appt confirmed? No  Patient said that his worker's comp is going to setup all the appointments they recommend he needs. He will call back if anything changes and schedule appointment if recommended by them.   [facility] f/u appt confirmed? Yes  Scheduled to see orthopedics   Are transportation arrangements needed? No   If their condition worsens, is the pt aware to call PCP or go to the Emergency Dept.? Yes  Was the patient provided with contact information for the PCP's office or ED? Yes  Was to pt encouraged to call back with questions or concerns? Yes

## 2019-12-03 ENCOUNTER — Other Ambulatory Visit: Payer: Self-pay

## 2019-12-04 ENCOUNTER — Encounter: Payer: Self-pay | Admitting: Student

## 2019-12-04 DIAGNOSIS — M25561 Pain in right knee: Secondary | ICD-10-CM | POA: Insufficient documentation

## 2019-12-05 ENCOUNTER — Encounter: Payer: Self-pay | Admitting: Internal Medicine

## 2019-12-05 ENCOUNTER — Ambulatory Visit: Payer: Self-pay

## 2019-12-05 ENCOUNTER — Other Ambulatory Visit: Payer: Self-pay

## 2019-12-05 ENCOUNTER — Ambulatory Visit (INDEPENDENT_AMBULATORY_CARE_PROVIDER_SITE_OTHER): Payer: No Typology Code available for payment source | Admitting: Internal Medicine

## 2019-12-05 DIAGNOSIS — M25561 Pain in right knee: Secondary | ICD-10-CM | POA: Diagnosis not present

## 2019-12-05 DIAGNOSIS — S36039D Unspecified laceration of spleen, subsequent encounter: Secondary | ICD-10-CM | POA: Diagnosis not present

## 2019-12-05 DIAGNOSIS — S20212D Contusion of left front wall of thorax, subsequent encounter: Secondary | ICD-10-CM

## 2019-12-05 MED ORDER — HYDROCODONE-ACETAMINOPHEN 5-325 MG PO TABS
1.0000 | ORAL_TABLET | ORAL | 0 refills | Status: DC | PRN
Start: 2019-12-05 — End: 2019-12-30

## 2019-12-05 NOTE — Assessment & Plan Note (Signed)
Class 2 Mild tenderness only  Will reassess in 3 weeks and decide about work

## 2019-12-05 NOTE — Assessment & Plan Note (Signed)
He is having a lot of pain from the ribs specifically Hasn't been helped by NSAIDs in the past

## 2019-12-05 NOTE — Assessment & Plan Note (Signed)
Partial meniscus tear and significant bruising/blood (without obvious swelling) Now in brace Will follow up with ortho in 3 weeks to decide about work from orthopedic standpoint Renew the hydrocodone

## 2019-12-05 NOTE — Progress Notes (Signed)
Subjective:    Patient ID: Louis Evans, male    DOB: 12/27/1960, 59 y.o.   MRN: [id number]  HPI Here with wife for hospital follow up This visit occurred during the SARS-CoV-2 public health emergency.  Safety protocols were in place, including screening questions prior to the visit, additional usage of staff PPE, and extensive cleaning of exam room while observing appropriate contact time as indicated for disinfecting solutions.   Working at night  Was picking up debris and fell into water drain that the cover was off hit Left side crashed into the side and the rest of him went to bottom Hit right knee also He got pulled up by crew---driven to [employer] by his boss  CT scan small splenic laceration and hematoma Transferred to [facility] then Seen by orthopedist for the right knee. Then got MRI---hematoma and some mild meniscus damage (unclear)  Observed 2 nights Given velcro wrap for trunk Couldn't use crutches---now able to weight bear on knee  Saw orthopedist yesterday Got brace for knee  Current Outpatient Medications on File Prior to Visit  Medication Sig Dispense Refill  . HYDROcodone-acetaminophen (NORCO/VICODIN) 5-325 MG tablet Take 1 tablet by mouth every 6 (six) hours as needed for moderate pain. 15 tablet 0  . levothyroxine (SYNTHROID) 100 MCG tablet TAKE 1 TABLET BY MOUTH EVERY DAY BEFORE BREAKFAST 90 tablet 3   No current facility-administered medications on file prior to visit.    No Known Allergies  Past Medical History:  Diagnosis Date  . Allergy   . BPH (benign prostatic hypertrophy)   . History of BPH   . Hyperlipidemia    mild  . Hypothyroidism   . 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 Post-operative nausea and vomiting   . Tobacco use disorder     Past Surgical History:  Procedure Laterality Date  . FINGER SURGERY     right hand middle finger/ cut nerve  . KNEE SURGERY     left knee    Family History    Problem Relation Age of Onset  . Hypertension Mother   . Diabetes Mother   . Diabetes Sister   . CAD Neg Hx   . Cancer Neg Hx        prostate or colon    Social History   Socioeconomic History  . Marital status: Married    Spouse name: Not on file  . Number of children: 3  . Years of education: Not on file  . Highest education level: Not on file  Occupational History  . Occupation: Maintenance    Employer: [employer]:    . Occupation:    Tobacco Use  . Smoking status: Former Smoker    Types: Cigarettes    Quit date: 09/21/2011    Years since quitting: 8.2  . Smokeless tobacco: Never Used  Substance and Sexual Activity  . Alcohol use: Yes    Alcohol/week: 2.0 standard drinks    Types: 2 Cans of beer per week  . Drug use: No  . Sexual activity: Not on file  Other Topics Concern  . Not on file  Social History Narrative  . Not on file   Social Determinants of Health   Financial Resource Strain:   . Difficulty of Paying Living Expenses: Not on file  Food Insecurity:   . Worried About Charity fundraiser in the Last Year: Not on file  . Ran  Out of Food in the Last Year: Not on file  Transportation Needs:   . Lack of Transportation (Medical): Not on file  . Lack of Transportation (Non-Medical): Not on file  Physical Activity:   . Days of Exercise per Week: Not on file  . Minutes of Exercise per Session: Not on file  Stress:   . Feeling of Stress : Not on file  Social Connections:   . Frequency of Communication with Friends and Family: Not on file  . Frequency of Social Gatherings with Friends and Family: Not on file  . Attends Religious Services: Not on file  . Active Member of Clubs or Organizations: Not on file  . Attends Archivist Meetings: Not on file  . Marital Status: Not on file  Intimate Partner Violence:   . Fear of Current or Ex-Partner: Not on file  . Emotionally Abused: Not on file  . Physically Abused: Not on file  .  Sexually Abused: Not on file   Review of Systems  Using hydrocodone for pain--every 6 hours Not sleeping great---will awaken with pain in left chest Appetite is okay No N/V Didn't move bowels for almost 1 week---finally did go     Objective:   Physical Exam Constitutional:      Appearance: Normal appearance.  Cardiovascular:     Rate and Rhythm: Normal rate and regular rhythm.     Heart sounds: No murmur heard.  No gallop.   Pulmonary:     Effort: Pulmonary effort is normal.     Breath sounds: Normal breath sounds. No wheezing or rales.     Comments: Left rib tenderness Abdominal:     Palpations: Abdomen is soft.     Tenderness: There is no abdominal tenderness.     Comments: LUQ tenderness and positive percussion of spleen  Musculoskeletal:     Comments: No right knee swelling Positive MacMurray's for lateral meniscus No ligament instability  Neurological:     Mental Status: He is alert.     Comments: Antalgic gait for right knee  Psychiatric:        Mood and Affect: Mood normal.        Behavior: Behavior normal.            Assessment & Plan:

## 2019-12-06 ENCOUNTER — Telehealth: Payer: Self-pay

## 2019-12-06 NOTE — Telephone Encounter (Signed)
Started PA on [email or web address] Key:   PA is pending, number to call if no response in 24 hours is [id number], [facility].    Contacted [facility] and spoke with [employer]. He reported the PA is still pending but that this med is only covered for 8 tablets per day and it was run through the pharmacy as 12 tabs per day. He will advise the nature of the pts injury and pt need and see if that will be taken under consideration. PA is still pending.

## 2019-12-09 NOTE — Telephone Encounter (Signed)
I don't think we need to contact the pt.

## 2019-12-09 NOTE — Telephone Encounter (Signed)
PA has been approved. Case ID # [id number] #: [id number].

## 2019-12-27 ENCOUNTER — Other Ambulatory Visit: Payer: Self-pay

## 2019-12-30 ENCOUNTER — Other Ambulatory Visit: Payer: Self-pay

## 2019-12-30 ENCOUNTER — Ambulatory Visit (INDEPENDENT_AMBULATORY_CARE_PROVIDER_SITE_OTHER): Payer: Worker's Compensation | Admitting: Internal Medicine

## 2019-12-30 ENCOUNTER — Encounter: Payer: Self-pay | Admitting: Internal Medicine

## 2019-12-30 DIAGNOSIS — S36039D Unspecified laceration of spleen, subsequent encounter: Secondary | ICD-10-CM

## 2019-12-30 MED ORDER — HYDROCODONE-ACETAMINOPHEN 5-325 MG PO TABS
1.0000 | ORAL_TABLET | ORAL | 0 refills | Status: DC | PRN
Start: 2019-12-30 — End: 2020-08-25

## 2019-12-30 NOTE — Progress Notes (Signed)
Subjective:    Patient ID: Louis Evans, male    DOB: 1960/05/20, 59 y.o.   MRN: [id number]  HPI Here for follow up of splenic laceration and knee injury This visit occurred during the SARS-CoV-2 public health emergency.  Safety protocols were in place, including screening questions prior to the visit, additional usage of staff PPE, and extensive cleaning of exam room while observing appropriate contact time as indicated for disinfecting solutions.   Still has some intermittent pain in the LUQ Unclear what brings on the pain Mostly sharp---sometimes around the flank or anteriorly  Has seen the orthopedist Has put him out of work for 4-5 more weeks Still having sharp pain at times Able to walk --but pain at times Having trouble with pain at night Still wearing brace for night and intermittently in day (can't tolerate all the time)  Current Outpatient Medications on File Prior to Visit  Medication Sig Dispense Refill  . diclofenac (VOLTAREN) 75 MG EC tablet Take 75 mg by mouth 2 (two) times daily.    Marland Kitchen HYDROcodone-acetaminophen (NORCO/VICODIN) 5-325 MG tablet Take 1-2 tablets by mouth every 4 (four) hours as needed for moderate pain. 60 tablet 0  . levothyroxine (SYNTHROID) 100 MCG tablet TAKE 1 TABLET BY MOUTH EVERY DAY BEFORE BREAKFAST 90 tablet 3   No current facility-administered medications on file prior to visit.    No Known Allergies  Past Medical History:  Diagnosis Date  . Allergy   . BPH (benign prostatic hypertrophy)   . History of BPH   . Hyperlipidemia    mild  . Hypothyroidism   . 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 Post-operative nausea and vomiting   . Tobacco use disorder     Past Surgical History:  Procedure Laterality Date  . FINGER SURGERY     right hand middle finger/ cut nerve  . KNEE SURGERY     left knee    Family History  Problem Relation Age of Onset  . Hypertension Mother   . Diabetes Mother    . Diabetes Sister   . CAD Neg Hx   . Cancer Neg Hx        prostate or colon    Social History   Socioeconomic History  . Marital status: Married    Spouse name: Not on file  . Number of children: 3  . Years of education: Not on file  . Highest education level: Not on file  Occupational History  . Occupation: Maintenance    Employer: [employer]:    . Occupation:    Tobacco Use  . Smoking status: Former Smoker    Types: Cigarettes    Quit date: 09/21/2011    Years since quitting: 8.2  . Smokeless tobacco: Never Used  Substance and Sexual Activity  . Alcohol use: Yes    Alcohol/week: 2.0 standard drinks    Types: 2 Cans of beer per week  . Drug use: No  . Sexual activity: Not on file  Other Topics Concern  . Not on file  Social History Narrative  . Not on file   Social Determinants of Health   Financial Resource Strain: Not on file  Food Insecurity: Not on file  Transportation Needs: Not on file  Physical Activity: Not on file  Stress: Not on file  Social Connections: Not on file  Intimate Partner Violence: Not on file   Review of Systems Has cut  back the norco to 1 at a time---was getting nausea with 2 Still taking the diclofenac regularly Bowels are okay    Objective:   Physical Exam Constitutional:      Appearance: Normal appearance.  Abdominal:     General: Abdomen is flat. Bowel sounds are normal.     Palpations: Abdomen is soft. There is no mass.     Tenderness: There is no abdominal tenderness.  Neurological:     Mental Status: He is alert.            Assessment & Plan:

## 2019-12-30 NOTE — Assessment & Plan Note (Addendum)
Pain is better---but still gets a sharp pain at times His return to work will be limited by the knee issues I expect the spleen will be resolved soon and he is due to be out for the knee for 4-5 more weeks Will just see him back prn If pain persists close to return to work date, will repeat abd CT scan

## 2020-08-25 ENCOUNTER — Other Ambulatory Visit: Payer: Self-pay

## 2020-08-25 ENCOUNTER — Ambulatory Visit (INDEPENDENT_AMBULATORY_CARE_PROVIDER_SITE_OTHER): Payer: BC Managed Care – PPO | Admitting: Internal Medicine

## 2020-08-25 ENCOUNTER — Encounter: Payer: Self-pay | Admitting: Internal Medicine

## 2020-08-25 VITALS — BP 104/68 | HR 69 | Temp 98.0°F | Ht 67.0 in | Wt 142.0 lb

## 2020-08-25 DIAGNOSIS — Z23 Encounter for immunization: Secondary | ICD-10-CM | POA: Diagnosis not present

## 2020-08-25 DIAGNOSIS — K219 Gastro-esophageal reflux disease without esophagitis: Secondary | ICD-10-CM

## 2020-08-25 DIAGNOSIS — E039 Hypothyroidism, unspecified: Secondary | ICD-10-CM | POA: Diagnosis not present

## 2020-08-25 DIAGNOSIS — Z0000 Encounter for general adult medical examination without abnormal findings: Secondary | ICD-10-CM

## 2020-08-25 LAB — COMPREHENSIVE METABOLIC PANEL
ALT: 15 U/L (ref 0–53)
AST: 19 U/L (ref 0–37)
Albumin: 4.1 g/dL (ref 3.5–5.2)
Alkaline Phosphatase: 61 U/L (ref 39–117)
BUN: 14 mg/dL (ref 6–23)
CO2: 26 mEq/L (ref 19–32)
Calcium: 9.5 mg/dL (ref 8.4–10.5)
Chloride: 105 mEq/L (ref 96–112)
Creatinine, Ser: 1.35 mg/dL (ref 0.40–1.50)
GFR: 56.65 mL/min — ABNORMAL LOW (ref >60.00)
Glucose, Bld: 82 mg/dL (ref 70–99)
Potassium: 5 mEq/L (ref 3.5–5.1)
Sodium: 139 mEq/L (ref 135–145)
Total Bilirubin: 1.2 mg/dL (ref 0.2–1.2)
Total Protein: 6.8 g/dL (ref 6.0–8.3)

## 2020-08-25 LAB — CBC
HCT: 44 % (ref 39.0–52.0)
Hemoglobin: 14.2 g/dL (ref 13.0–17.0)
MCHC: 32.2 g/dL (ref 30.0–36.0)
MCV: 80.3 fl (ref 78.0–100.0)
Platelets: 292 10*3/uL (ref 150.0–400.0)
RBC: 5.48 Mil/uL (ref 4.22–5.81)
RDW: 15.1 % (ref 11.5–15.5)
WBC: 8.8 10*3/uL (ref 4.0–10.5)

## 2020-08-25 LAB — T4, FREE: Free T4: 1.1 ng/dL (ref 0.60–1.60)

## 2020-08-25 LAB — TSH: TSH: 0.99 u[IU]/mL (ref 0.35–5.50)

## 2020-08-25 MED ORDER — OMEPRAZOLE 20 MG PO CPDR: 20.0000 mg | DELAYED_RELEASE_CAPSULE | Freq: Every day | ORAL | 3 refills | Status: DC

## 2020-08-25 NOTE — Addendum Note (Signed)
Addended by: Pilar Grammes on: 08/25/2020 02:07 PM   Modules accepted: Orders

## 2020-08-25 NOTE — Assessment & Plan Note (Signed)
Seems to be euthyroid Will check labs 

## 2020-08-25 NOTE — Assessment & Plan Note (Signed)
Healthy but ongoing knee problems---out of work still Discussed exercise Prefers no COVID vaccine shingrix #2 today Flu vaccine in the fall Colon due again in December Will defer PSA to next year

## 2020-08-25 NOTE — Progress Notes (Signed)
Subjective:    Patient ID: Louis Evans, male    DOB: 08/02/1960, 60 y.o.   MRN: [id number]  HPI Here for physical This visit occurred during the SARS-CoV-2 public health emergency.  Safety protocols were in place, including screening questions prior to the visit, additional usage of staff PPE, and extensive cleaning of exam room while observing appropriate contact time as indicated for disinfecting solutions.   Still on Worker's comp--due to ongoing right knee No abdominal symptoms---spleen seems to have healed No light duty available Using topical gel and oral diclofenac  No other concerns  Current Outpatient Medications on File Prior to Visit  Medication Sig Dispense Refill   diclofenac (VOLTAREN) 75 MG EC tablet Take 75 mg by mouth 2 (two) times daily.     levothyroxine (SYNTHROID) 100 MCG tablet TAKE 1 TABLET BY MOUTH EVERY DAY BEFORE BREAKFAST 90 tablet 3   No current facility-administered medications on file prior to visit.    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   had 4 months of INH   Post-operative nausea and vomiting    Tobacco use disorder     Past Surgical History:  Procedure Laterality Date   FINGER SURGERY     right hand middle finger/ cut nerve   KNEE SURGERY     left knee    Family History  Problem Relation Age of Onset   Hypertension Mother    Diabetes Mother    Diabetes Sister    CAD Neg Hx    Cancer Neg Hx        prostate or colon    Social History   Socioeconomic History   Marital status: Married    Spouse name: Not on file   Number of children: 3   Years of education: Not on file   Highest education level: Not on file  Occupational History   Occupation: Maintenance    Employer: [employer]:     Occupation:    Tobacco Use    Smoking status: Former    Types: Cigarettes    Quit date: 09/21/2011    Years since quitting: 8.9   Smokeless tobacco: Never  Substance and Sexual Activity   Alcohol use: Yes    Alcohol/week: 2.0 standard drinks    Types: 2 Cans of beer per week   Drug use: No   Sexual activity: Not on file  Other Topics Concern   Not on file  Social History Narrative   Not on file   Social Determinants of Health   Financial Resource Strain: Not on file  Food Insecurity: Not on file  Transportation Needs: Not on file  Physical Activity: Not on file  Stress: Not on file  Social Connections: Not on file  Intimate Partner Violence: Not on file   Review of Systems  Constitutional:  Negative for fatigue and unexpected weight change.       Wears seat belt  HENT:  Positive for hearing loss and tinnitus. Negative for trouble swallowing.        Uses hearing aides Upper plate---few lower teeth  Eyes:  Negative for visual disturbance.       No diplopia or unilateral vision loss  Respiratory:  Negative for cough, chest tightness and shortness  of breath.   Cardiovascular:  Negative for chest pain, palpitations and leg swelling.  Gastrointestinal:  Negative for blood in stool and constipation.       Some heartburn----tums or rolaids helps (can be daily)  Endocrine: Negative for polydipsia and polyuria.  Genitourinary:  Negative for urgency.       Slow stream in AM No sexual problems  Musculoskeletal:  Negative for back pain and joint swelling.       No other joint problems  Skin:  Negative for rash.       No suspicious lesions  Allergic/Immunologic: Negative for environmental allergies and immunocompromised state.  Neurological:  Negative for dizziness, syncope, light-headedness and headaches.  Hematological:  Negative for adenopathy. Does not bruise/bleed easily.  Psychiatric/Behavioral:  Negative for dysphoric mood and sleep disturbance. The patient is not nervous/anxious.       Objective:    Physical Exam Constitutional:      Appearance: Normal appearance.  HENT:     Right Ear: Tympanic membrane and ear canal normal.     Left Ear: Tympanic membrane and ear canal normal.     Mouth/Throat:     Pharynx: No oropharyngeal exudate or posterior oropharyngeal erythema.  Eyes:     Conjunctiva/sclera: Conjunctivae normal.     Pupils: Pupils are equal, round, and reactive to light.  Cardiovascular:     Rate and Rhythm: Normal rate and regular rhythm.     Pulses: Normal pulses.     Heart sounds: No murmur heard.   No gallop.  Pulmonary:     Effort: Pulmonary effort is normal.     Breath sounds: Normal breath sounds. No wheezing or rales.  Abdominal:     Palpations: Abdomen is soft.     Tenderness: There is no abdominal tenderness.  Musculoskeletal:     Cervical back: Neck supple.     Right lower leg: No edema.     Left lower leg: No edema.  Lymphadenopathy:     Cervical: No cervical adenopathy.  Skin:    General: Skin is warm.     Findings: No rash.  Neurological:     General: No focal deficit present.     Mental Status: He is alert and oriented to person, place, and time.  Psychiatric:        Mood and Affect: Mood normal.        Behavior: Behavior normal.           Assessment & Plan:

## 2020-08-25 NOTE — Patient Instructions (Signed)
Take the omeprazole daily on an empty stomach. If your heartburn goes completely away, you can cut back to every other day (or even consider just taking it for a few days if the heartburn acts up again).

## 2020-08-25 NOTE — Assessment & Plan Note (Signed)
Having daily symptoms so will try daily PPI and then wean as tolerated

## 2020-08-26 ENCOUNTER — Telehealth: Payer: Self-pay

## 2020-08-26 NOTE — Telephone Encounter (Signed)
Pts wife left v/m (DPR signed) that pt got 2nd shingrix on 08/25/20 and wanted to know if that would make pt feel run down,sluggish, or tired and wanted to speak with [facility] CMA. I called back and spoke with pt and advised yes the shingrix vaccine could make you feel tired and sluggish. Pt had no other possible side effects and pt said he took ibuprofen and felt better. Advised pt symptoms usually go away in 2 - 3 days. Pt voiced understanding and will cb if needed.nothing further needed at this time.

## 2020-09-09 ENCOUNTER — Encounter: Payer: BC Managed Care – PPO | Admitting: Internal Medicine

## 2020-11-10 ENCOUNTER — Other Ambulatory Visit: Payer: Self-pay | Admitting: Internal Medicine

## 2021-01-20 ENCOUNTER — Other Ambulatory Visit: Payer: Self-pay

## 2021-01-20 ENCOUNTER — Ambulatory Visit (INDEPENDENT_AMBULATORY_CARE_PROVIDER_SITE_OTHER): Payer: BC Managed Care – PPO

## 2021-01-20 ENCOUNTER — Ambulatory Visit
Admission: EM | Admit: 2021-01-20 | Discharge: 2021-01-20 | Disposition: A | Discharge status: Home / Self Care | Payer: BC Managed Care – PPO | Attending: Internal Medicine | Admitting: Internal Medicine

## 2021-01-20 DIAGNOSIS — J069 Acute upper respiratory infection, unspecified: Secondary | ICD-10-CM | POA: Diagnosis not present

## 2021-01-20 DIAGNOSIS — R059 Cough, unspecified: Secondary | ICD-10-CM

## 2021-01-20 MED ORDER — DOXYCYCLINE HYCLATE 100 MG PO CAPS: 100.0000 mg | ORAL_CAPSULE | Freq: Two times a day (BID) | ORAL | 0 refills | Status: DC

## 2021-01-20 MED ORDER — BENZONATATE 100 MG PO CAPS: 100.0000 mg | ORAL_CAPSULE | Freq: Three times a day (TID) | ORAL | 0 refills | Status: DC | PRN

## 2021-01-20 MED ORDER — PREDNISONE 20 MG PO TABS: 40.0000 mg | ORAL_TABLET | Freq: Every day | ORAL | 0 refills | Status: AC

## 2021-01-20 NOTE — ED Triage Notes (Signed)
Cough, sinus pressure and sneezing started before christmas and have worsened within the last week. Notes sore throat that has decreased in intensity since the onset. Negative at home covid tests. One episode of post-tussive emesis. No diarrhea.  Has been taking otc cough meds w/o relief.

## 2021-01-20 NOTE — ED Provider Notes (Signed)
[facility]    CSN: [id number] Arrival date & time: 01/20/21  1305      History   Chief Complaint Chief Complaint  Patient presents with   Cough    HPI Louis Evans is a 61 y.o. male.   Patient presents with over 10-day history of cough, sinus pressure, nasal congestion that has worsened over the past few days.  Patient reports that cough is dry but that he "feels mucus in his throat".  Patient took several COVID tests  at home that were all negative.  Denies any known sick contacts.  Denies any fevers.  Patient has taken several over-the-counter cough and cold medications with minimal improvement in symptoms.  Denies chest pain, shortness of breath, ear pain, nausea, vomiting, diarrhea, abdominal pain.   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   had 4 months of INH   Post-operative nausea and vomiting    Tobacco use disorder     Patient Active Problem List   Diagnosis Date Noted   Gastroesophageal reflux disease 08/25/2020   Right knee pain 12/04/2019   Splenic laceration 11/28/2019   Adenomatous polyp of colon 07/03/2015   Hypothyroidism, unspecified    BPH with obstruction/lower urinary tract symptoms    Routine general medical examination at a health care facility 06/18/2012   ALLERGIC RHINITIS 05/28/2008    Past Surgical History:  Procedure Laterality Date   FINGER SURGERY     right hand middle finger/ cut nerve   KNEE SURGERY     left knee       Home Medications    Prior to Admission medications   Medication Sig Start Date End Date Taking? Authorizing Provider  benzonatate (TESSALON) 100 MG capsule Take 1 capsule (100 mg total) by mouth every 8 (eight) hours as needed for cough. 01/20/21  Yes Usha Slager, Michele Rockers, FNP  doxycycline (VIBRAMYCIN) 100 MG capsule Take 1 capsule  (100 mg total) by mouth 2 (two) times daily. 01/20/21  Yes Jaclyn Carew, Hildred Alamin E, FNP  predniSONE (DELTASONE) 20 MG tablet Take 2 tablets (40 mg total) by mouth daily for 5 days. 01/20/21 01/25/21 Yes Kandise Riehle, Michele Rockers, FNP  diclofenac (VOLTAREN) 75 MG EC tablet Take 75 mg by mouth 2 (two) times daily. 12/06/19   [provider]  levothyroxine (SYNTHROID) 100 MCG tablet TAKE 1 TABLET BY MOUTH EVERY DAY BEFORE BREAKFAST 11/10/20   Venia Carbon, MD  omeprazole (PRILOSEC) 20 MG capsule Take 1 capsule (20 mg total) by mouth daily. 08/25/20   Venia Carbon, MD    Family History Family History  Problem Relation Age of Onset   Hypertension Mother    Diabetes Mother    Diabetes Sister    CAD Neg Hx    Cancer Neg Hx        prostate or colon    Social History Social History   Tobacco Use   Smoking status: Former    Types: Cigarettes    Quit date: 09/21/2011    Years since quitting: 9.3   Smokeless tobacco: Never  Substance Use Topics   Alcohol use: Yes    Alcohol/week: 2.0 standard drinks    Types: 2 Cans of beer per week   Drug use: No     Allergies   Patient  has no known allergies.   Review of Systems Review of Systems Per HPI  Physical Exam Triage Vital Signs ED Triage Vitals  Enc Vitals Group     BP 01/20/21 1403 123/77     Pulse Rate 01/20/21 1403 66     Resp 01/20/21 1403 18     Temp 01/20/21 1403 98.3 F (36.8 C)     Temp Source 01/20/21 1403 Oral     SpO2 01/20/21 1403 97 %     Weight --      Height --      Head Circumference --      Peak Flow --      Pain Score 01/20/21 1405 5     Pain Loc --      Pain Edu? --      Excl. in [place]? --    No data found.  Updated Vital Signs BP 123/77 (BP Location: Left Arm)    Pulse 66    Temp 98.3 F (36.8 C) (Oral)    Resp 18    SpO2 97%   Visual Acuity Right Eye Distance:   Left Eye Distance:   Bilateral Distance:    Right Eye Near:   Left Eye Near:    Bilateral Near:     Physical Exam Constitutional:       General: He is not in acute distress.    Appearance: Normal appearance. He is not toxic-appearing or diaphoretic.  HENT:     Head: Normocephalic and atraumatic.     Right Ear: Tympanic membrane and ear canal normal.     Left Ear: Tympanic membrane and ear canal normal.     Nose: Congestion present.     Mouth/Throat:     Mouth: Mucous membranes are moist.     Pharynx: No posterior oropharyngeal erythema.  Eyes:     Extraocular Movements: Extraocular movements intact.     Conjunctiva/sclera: Conjunctivae normal.     Pupils: Pupils are equal, round, and reactive to light.  Cardiovascular:     Rate and Rhythm: Normal rate and regular rhythm.     Pulses: Normal pulses.     Heart sounds: Normal heart sounds.  Pulmonary:     Effort: Pulmonary effort is normal. No respiratory distress.     Breath sounds: Normal breath sounds. No stridor. No wheezing, rhonchi or rales.     Comments: Harsh cough on exam. Abdominal:     General: Abdomen is flat. Bowel sounds are normal.     Palpations: Abdomen is soft.  Musculoskeletal:        General: Normal range of motion.     Cervical back: Normal range of motion.  Skin:    General: Skin is warm and dry.  Neurological:     General: No focal deficit present.     Mental Status: He is alert and oriented to person, place, and time. Mental status is at baseline.  Psychiatric:        Mood and Affect: Mood normal.        Behavior: Behavior normal.     UC Treatments / Results  Labs (all labs ordered are listed, but only abnormal results are displayed) Labs Reviewed - No data to display  EKG   Radiology DG Chest 2 View  Result Date: 01/20/2021 CLINICAL DATA:  Cough EXAM: CHEST - 2 VIEW COMPARISON:  11/28/2019 FINDINGS: Mild hyperinflation. Midline trachea. Normal heart size and mediastinal contours. Diffuse peribronchial thickening. No pleural effusion or pneumothorax. Clear lungs. IMPRESSION: 1.  No acute cardiopulmonary disease. 2. Mild  hyperinflation and peribronchial thickening which may relate to chronic bronchitis or smoking. Electronically Signed   By: Abigail Miyamoto M.D.   On: 01/20/2021 14:40    Procedures Procedures (including critical care time)  Medications Ordered in UC Medications - No data to display  Initial Impression / Assessment and Plan / UC Course  I have reviewed the triage vital signs and the nursing notes.  Pertinent labs & imaging results that were available during my care of the patient were reviewed by me and considered in my medical decision making (see chart for details).     Patient's symptoms appear viral in etiology.  Chest x-ray was negative for any acute cardiopulmonary process.  It did show signs of chronic bronchitis which could be related to tobacco smoking and patient confirms that he used to smoke cigarettes.  Will cover with doxycycline antibiotic as well as prednisone steroid to decrease inflammation.  Benzonatate prescribed to take as needed for cough.  Do not think that viral testing is necessary given duration of symptoms.  Discussed return precautions.  Patient verbalized understanding and was agreeable to plan. Final Clinical Impressions(s) / UC Diagnoses   Final diagnoses:  Viral upper respiratory tract infection with cough     Discharge Instructions      You have been prescribed 3 medications to help alleviate your symptoms.  Please follow-up with urgent care or primary care if symptoms persist or worsen.     ED Prescriptions     Medication Sig Dispense Auth. Provider   doxycycline (VIBRAMYCIN) 100 MG capsule Take 1 capsule (100 mg total) by mouth 2 (two) times daily. 20 capsule [place], Leonard E, [place]   predniSONE (DELTASONE) 20 MG tablet Take 2 tablets (40 mg total) by mouth daily for 5 days. 10 tablet [place], Felsenthal E, [place]   benzonatate (TESSALON) 100 MG capsule Take 1 capsule (100 mg total) by mouth every 8 (eight) hours as needed for cough. 21 capsule [place], Michele Rockers, [place]       PDMP not reviewed this encounter.   Teodora Medici, [place] 01/20/21 1455

## 2021-01-20 NOTE — Discharge Instructions (Signed)
You have been prescribed 3 medications to help alleviate your symptoms.  Please follow-up with urgent care or primary care if symptoms persist or worsen.

## 2021-04-21 ENCOUNTER — Encounter: Payer: Self-pay | Admitting: Gastroenterology

## 2021-06-07 ENCOUNTER — Other Ambulatory Visit: Payer: Self-pay | Admitting: Family Medicine

## 2021-06-07 ENCOUNTER — Ambulatory Visit: Payer: No Typology Code available for payment source

## 2021-06-07 DIAGNOSIS — M25522 Pain in left elbow: Secondary | ICD-10-CM

## 2021-07-19 IMAGING — MR MR KNEE*R* W/O CM
9 series · 40 of 40 positions shown · non-contrast
Comparison: None.

CLINICAL DATA: Fell approximately 3-4 feet into a hole. Landed on
the left side.

EXAM:
MRI OF THE RIGHT KNEE WITHOUT CONTRAST
TECHNIQUE: Multiplanar, multisequence MR imaging of the knee was performed. No
intravenous contrast was administered.

[Series 8: T2 fat-sat · axial · right · 4.0mm · 0.36mm/px · z∈[-44,+85]mm · 4 of 27 slices shown (1 of 2)]
[im 1/27]
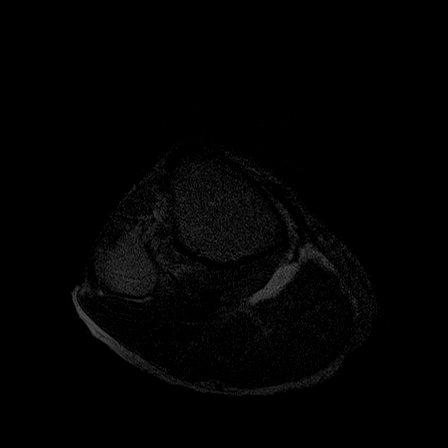
[im 9/27]
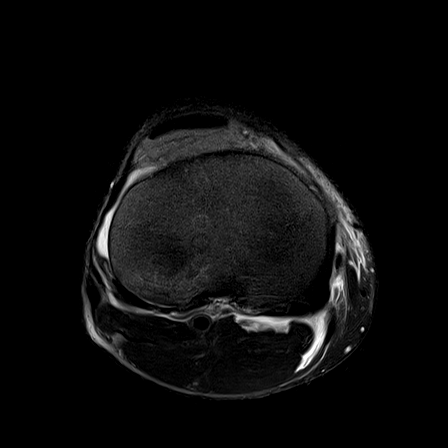
[im 18/27]
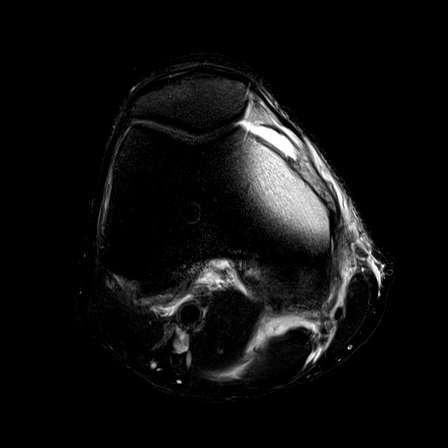
[im 27/27]
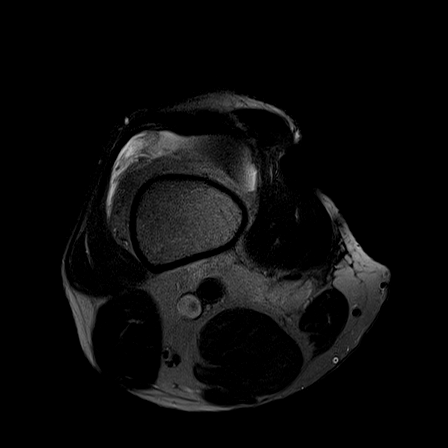

[Series 9: T1 · coronal · right · 4.0mm · 0.49mm/px · 4 of 29 slices shown]
[im 1/29]
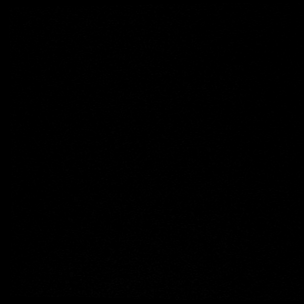
[im 10/29]
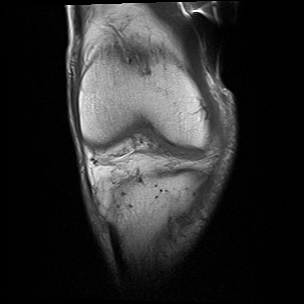
[im 19/29]
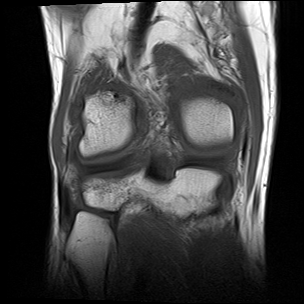
[im 29/29]
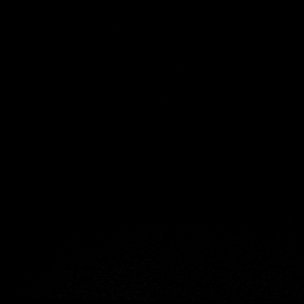

[Series 11: T2 fat-sat · coronal · right · 4.0mm · 0.59mm/px · 5 of 27 slices shown (2 of 2)]
[im 1/27]
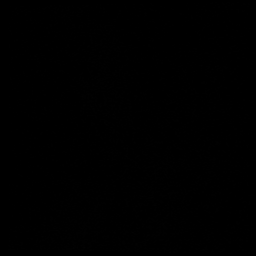
[im 7/27]
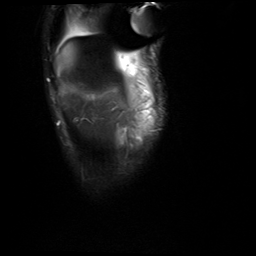
[im 14/27]
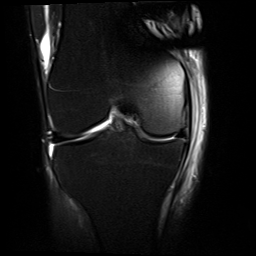
[im 20/27]
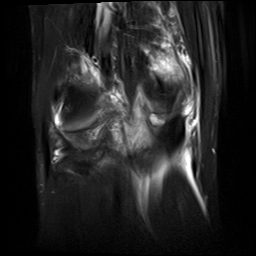
[im 27/27]
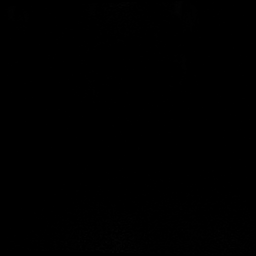

[Series 12: STIR · axial · right · 4.0mm · 0.49mm/px · z∈[-45,+85]mm · 5 of 27 slices shown (1 of 3)]
[im 1/27]
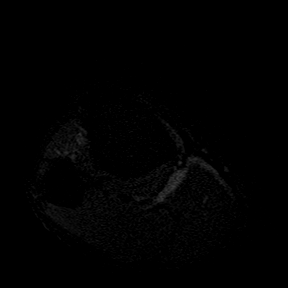
[im 7/27]
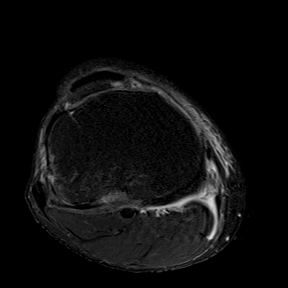
[im 14/27]
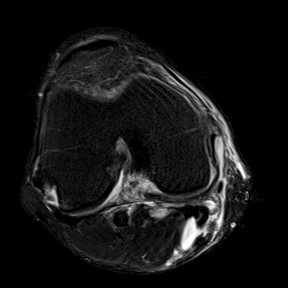
[im 20/27]
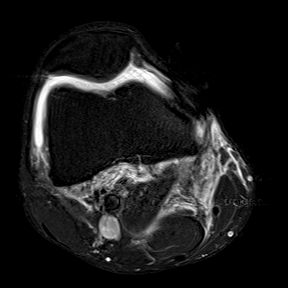
[im 27/27]
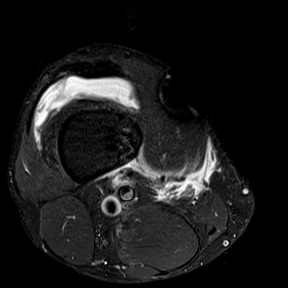

[Series 13: PD fat-sat · coronal · right · 4.0mm · 0.47mm/px · 5 of 30 slices shown (1 of 2)]
[im 1/30]
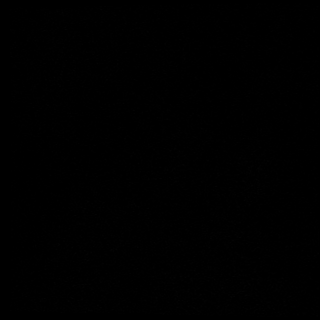
[im 8/30]
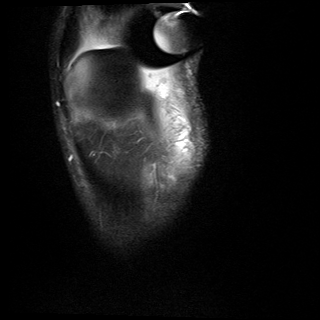
[im 15/30]
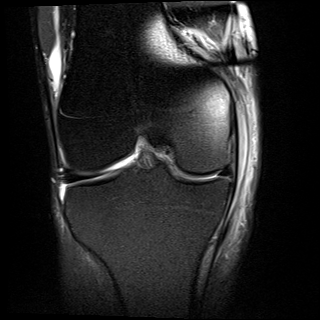
[im 22/30]
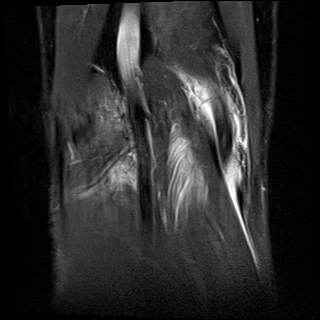
[im 30/30]
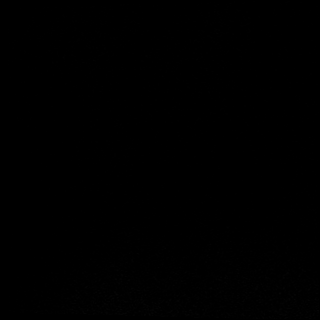

[Series 14: PD fat-sat · sagittal · right · 3.0mm · 0.52mm/px · 5 of 30 slices shown (2 of 2)]
[im 1/30]
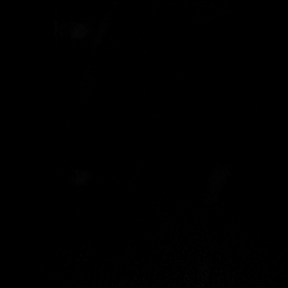
[im 8/30]
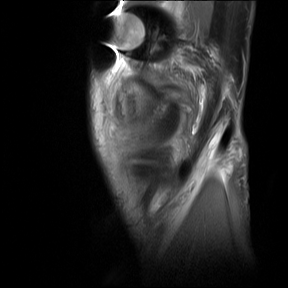
[im 15/30]
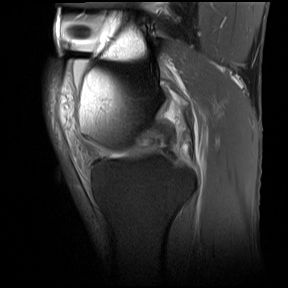
[im 22/30]
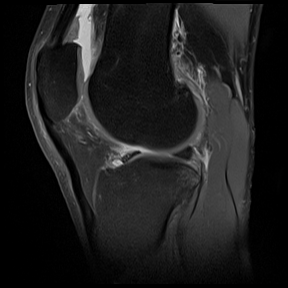
[im 30/30]
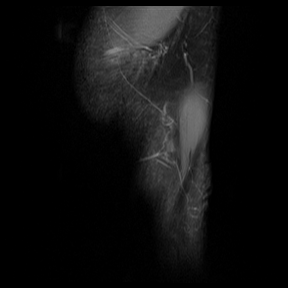

[Series 15: STIR · sagittal · right · 3.0mm · 0.52mm/px · 5 of 30 slices shown (2 of 3)]
[im 1/30]
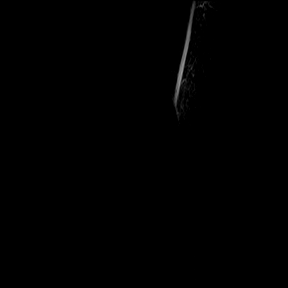
[im 8/30]
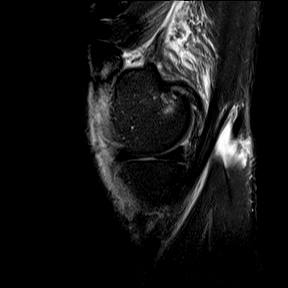
[im 15/30]
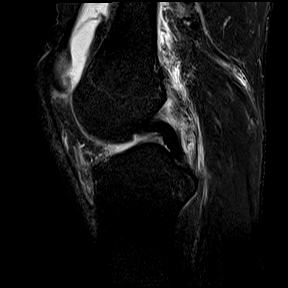
[im 22/30]
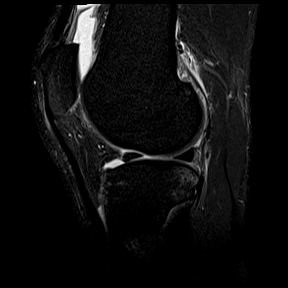
[im 30/30]
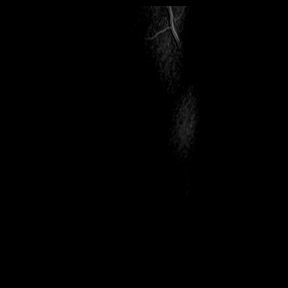

[Series 16: PD · oblique · right · 2.0mm · 0.47mm/px · 2 of 10 slices shown]
[im 1/10]
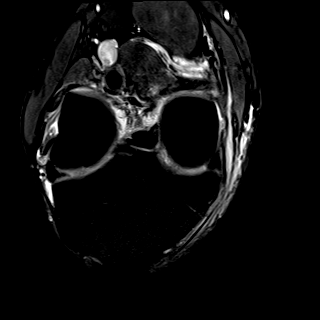
[im 10/10]
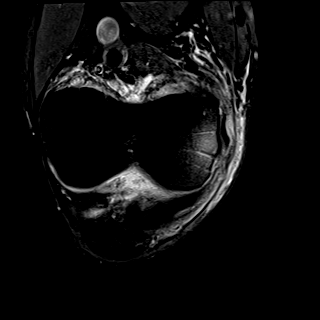

[Series 17: STIR · coronal · right · 4.0mm · 0.49mm/px · 5 of 29 slices shown (3 of 3)]
[im 1/29]
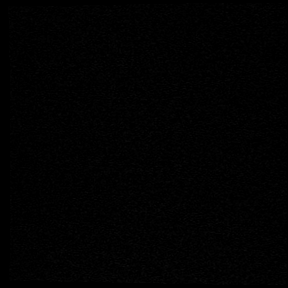
[im 8/29]
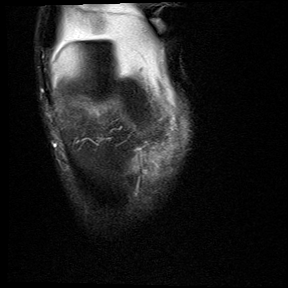
[im 15/29]
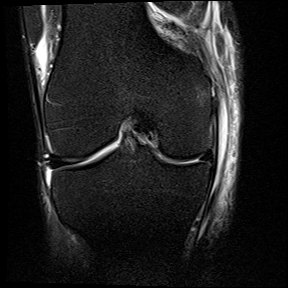
[im 22/29]
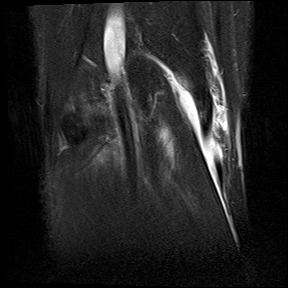
[im 29/29]
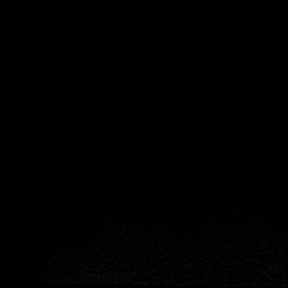

[40 of 40 positions shown; findings below may reference images not displayed]

FINDINGS: MENISCI

Medial: Intact.

Lateral: Irregularity of the posterior horn of the lateral meniscus
(image 10/series 14) which may be artifactual versus a small tear
along the superior surface.

LIGAMENTS

Cruciates: ACL and PCL are intact.

Collaterals: Medial collateral ligament is intact. Lateral
collateral ligament complex is intact. Moderate amount of fluid
along the superficial aspect of the MCL concerning for a liquified
hematoma or MCL bursitis.

CARTILAGE

Patellofemoral:  No chondral defect.

Medial:  No chondral defect.

Lateral: Mild cartilage irregularity of the weight-bearing surface
of the lateral femoral condyle. No chondral defect.

JOINT: No joint effusion. Normal [place] P[place]. No plical
thickening.

POPLITEAL FOSSA: Popliteus tendon is intact. Small ruptured Baker's
cyst.

EXTENSOR MECHANISM: Intact quadriceps tendon. Intact patellar
tendon. Intact lateral patellar retinaculum. Intact medial patellar
retinaculum. Intact MPFL.

BONES: No aggressive osseous lesion. No fracture or dislocation.
Mild bone marrow edema in the posterolateral tibial plateau likely
reflecting an osseous contusion.

Other: No fluid collection or hematoma. Muscles are normal.
IMPRESSION: 1. Moderate amount of fluid along the superficial aspect of the MCL
concerning for a liquified hematoma or MCL bursitis. MCL is intact.
2. Irregularity of the posterior horn of the lateral meniscus (image
10/series 14) which may be artifactual versus a small tear along the
superior surface.
3. Mild osseous contusion of the posterolateral tibial plateau.
4. No meniscal or ligamentous injury of right knee.

## 2021-08-18 ENCOUNTER — Other Ambulatory Visit: Payer: Self-pay | Admitting: Internal Medicine

## 2021-09-01 ENCOUNTER — Encounter: Payer: BC Managed Care – PPO | Admitting: Internal Medicine

## 2022-01-13 DIAGNOSIS — M79642 Pain in left hand: Secondary | ICD-10-CM | POA: Insufficient documentation

## 2022-01-27 ENCOUNTER — Other Ambulatory Visit: Payer: Self-pay | Admitting: Internal Medicine

## 2022-04-11 ENCOUNTER — Encounter: Payer: Self-pay | Admitting: Internal Medicine

## 2022-04-11 ENCOUNTER — Ambulatory Visit (INDEPENDENT_AMBULATORY_CARE_PROVIDER_SITE_OTHER): Payer: BC Managed Care – PPO | Admitting: Internal Medicine

## 2022-04-11 VITALS — BP 104/78 | HR 68 | Temp 97.3°F | Ht 66.75 in | Wt 147.0 lb

## 2022-04-11 DIAGNOSIS — E039 Hypothyroidism, unspecified: Secondary | ICD-10-CM | POA: Diagnosis not present

## 2022-04-11 DIAGNOSIS — Z125 Encounter for screening for malignant neoplasm of prostate: Secondary | ICD-10-CM

## 2022-04-11 DIAGNOSIS — Z0000 Encounter for general adult medical examination without abnormal findings: Secondary | ICD-10-CM | POA: Diagnosis not present

## 2022-04-11 DIAGNOSIS — F17210 Nicotine dependence, cigarettes, uncomplicated: Secondary | ICD-10-CM | POA: Insufficient documentation

## 2022-04-11 LAB — LIPID PANEL
Cholesterol: 225 mg/dL — ABNORMAL HIGH (ref 0–200)
HDL: 61.9 mg/dL (ref >39.00)
LDL Cholesterol: 152 mg/dL — ABNORMAL HIGH (ref 0–99)
NonHDL: 163.34
Total CHOL/HDL Ratio: 4
Triglycerides: 56 mg/dL (ref 0.0–149.0)
VLDL: 11.2 mg/dL (ref 0.0–40.0)

## 2022-04-11 LAB — COMPREHENSIVE METABOLIC PANEL
ALT: 18 U/L (ref 0–53)
AST: 20 U/L (ref 0–37)
Albumin: 4.3 g/dL (ref 3.5–5.2)
Alkaline Phosphatase: 60 U/L (ref 39–117)
BUN: 15 mg/dL (ref 6–23)
CO2: 28 mEq/L (ref 19–32)
Calcium: 9.6 mg/dL (ref 8.4–10.5)
Chloride: 105 mEq/L (ref 96–112)
Creatinine, Ser: 1.38 mg/dL (ref 0.40–1.50)
GFR: 54.55 mL/min — ABNORMAL LOW (ref >60.00)
Glucose, Bld: 85 mg/dL (ref 70–99)
Potassium: 5.2 mEq/L — ABNORMAL HIGH (ref 3.5–5.1)
Sodium: 139 mEq/L (ref 135–145)
Total Bilirubin: 1.3 mg/dL — ABNORMAL HIGH (ref 0.2–1.2)
Total Protein: 6.7 g/dL (ref 6.0–8.3)

## 2022-04-11 LAB — CBC
HCT: 45 % (ref 39.0–52.0)
Hemoglobin: 14.8 g/dL (ref 13.0–17.0)
MCHC: 32.9 g/dL (ref 30.0–36.0)
MCV: 80.1 fl (ref 78.0–100.0)
Platelets: 319 10*3/uL (ref 150.0–400.0)
RBC: 5.62 Mil/uL (ref 4.22–5.81)
RDW: 15.2 % (ref 11.5–15.5)
WBC: 8.9 10*3/uL (ref 4.0–10.5)

## 2022-04-11 MED ORDER — BUPROPION HCL ER (SR) 150 MG PO TB12: 150.0000 mg | ORAL_TABLET | Freq: Two times a day (BID) | ORAL | 11 refills | Status: DC

## 2022-04-11 NOTE — Patient Instructions (Signed)
Start the bupropion with one daily in the morning. If no problems after a week, increase to twice a day and stop the cigarettes. You can still use a nicotine lozenge if you get the urge to smoke.

## 2022-04-11 NOTE — Assessment & Plan Note (Signed)
Healthy but has had a hard time being out of work again--can't stay in [place] for colon--will contact GI Check PSA Prefers no COVID vaccines Recommended flu vaccine in the fall

## 2022-04-11 NOTE — Progress Notes (Signed)
Subjective:    Patient ID: Louis Evans, male    DOB: 1960-07-28, 62 y.o.   MRN: [id number]  HPI Here for physical  Tried the patch---got bad itching so went back to smoking Wants to stop---but concerned about side effects Still out of work from biceps tear on left-gets stressed out easily and easily angry Now with ongoing nerve damage and testing  Feels his thyroid is fine  Voids fair Slow in the morning but not troublesome  Current Outpatient Medications on File Prior to Visit  Medication Sig Dispense Refill   levothyroxine (SYNTHROID) 100 MCG tablet TAKE 1 TABLET BY MOUTH EVERY DAY BEFORE BREAKFAST. NEEDS OFFICE VISIT 90 tablet 0   No current facility-administered medications on file prior to visit.    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   had 4 months of INH   Post-operative nausea and vomiting    Tobacco use disorder     Past Surgical History:  Procedure Laterality Date   FINGER SURGERY     right hand middle finger/ cut nerve   KNEE SURGERY     left knee    Family History  Problem Relation Age of Onset   Hypertension Mother    Diabetes Mother    Diabetes Sister    CAD Neg Hx    Cancer Neg Hx        prostate or colon    Social History   Socioeconomic History   Marital status: Married    Spouse name: Not on file   Number of children: 3   Years of education: Not on file   Highest education level: Not on file  Occupational History   Occupation: Maintenance    Employer: [employer]: Worker's comp since 11/21   Occupation:    Tobacco Use   Smoking status: Every Day    Types: Cigarettes    Passive exposure: Never   Smokeless tobacco: Never  Substance and Sexual Activity   Alcohol use: Yes    Alcohol/week: 2.0 standard drinks of alcohol     Types: 2 Cans of beer per week   Drug use: No   Sexual activity: Not on file  Other Topics Concern   Not on file  Social History Narrative   Not on file   Social Determinants of Health   Financial Resource Strain: Not on file  Food Insecurity: Not on file  Transportation Needs: Not on file  Physical Activity: Not on file  Stress: Not on file  Social Connections: Not on file  Intimate Partner Violence: Not on file   Review of Systems  Constitutional:  Negative for fatigue and unexpected weight change.       Wears seat belt  HENT:  Positive for hearing loss and tinnitus.        Needs hearing aides adjusted Upper plate---some on bottom  Eyes:  Negative for visual disturbance.       No diplopia or unilateral vision loss  Respiratory:  Negative for cough, chest tightness and shortness of breath.   Cardiovascular:  Negative for chest pain, palpitations and leg swelling.  Gastrointestinal:  Negative for blood in stool and constipation.       Heartburn with tomato sauce---tums relieves  Endocrine: Negative for  polydipsia and polyuria.  Genitourinary:  Negative for urgency.       Mild symptoms No sexual problems  Musculoskeletal:  Positive for arthralgias. Negative for joint swelling.       Gets back pain with extended bending (like weeding)  Skin:  Negative for rash.  Allergic/Immunologic: Positive for environmental allergies. Negative for immunocompromised state.       Not enough for meds  Neurological:  Negative for dizziness, syncope, light-headedness and headaches.  Hematological:  Negative for adenopathy. Bruises/bleeds easily.  Psychiatric/Behavioral:  Negative for dysphoric mood and sleep disturbance. The patient is nervous/anxious.        Objective:   Physical Exam Constitutional:      Appearance: Normal appearance.  HENT:     Mouth/Throat:     Pharynx: No oropharyngeal exudate or posterior oropharyngeal erythema.  Eyes:     Conjunctiva/sclera: Conjunctivae  normal.     Pupils: Pupils are equal, round, and reactive to light.  Cardiovascular:     Rate and Rhythm: Normal rate and regular rhythm.     Pulses: Normal pulses.     Heart sounds:     No gallop.  Pulmonary:     Effort: Pulmonary effort is normal.     Breath sounds: Normal breath sounds. No wheezing or rales.  Abdominal:     Palpations: Abdomen is soft.     Tenderness: There is no abdominal tenderness.  Musculoskeletal:     Cervical back: Neck supple.     Right lower leg: No edema.     Left lower leg: No edema.  Lymphadenopathy:     Cervical: No cervical adenopathy.  Skin:    Findings: No lesion or rash.  Neurological:     General: No focal deficit present.     Mental Status: He is alert and oriented to person, place, and time.  Psychiatric:        Mood and Affect: Mood normal.        Behavior: Behavior normal.            Assessment & Plan:

## 2022-04-11 NOTE — Assessment & Plan Note (Signed)
Didn't tolerate nicotine patch Will try bupropion 150 bid ---discussed side effects and how to use Nicotine lozenges too prn

## 2022-04-11 NOTE — Assessment & Plan Note (Signed)
Seems euthyroid ?Will check labs ?

## 2022-04-12 LAB — PSA: PSA: 1.75 ng/mL (ref 0.10–4.00)

## 2022-05-03 ENCOUNTER — Other Ambulatory Visit: Payer: Self-pay | Admitting: Internal Medicine

## 2022-05-03 NOTE — Telephone Encounter (Signed)
Please void previos rx for #60/11

## 2022-05-12 ENCOUNTER — Other Ambulatory Visit: Payer: Self-pay | Admitting: Internal Medicine

## 2022-05-17 DIAGNOSIS — G5602 Carpal tunnel syndrome, left upper limb: Secondary | ICD-10-CM | POA: Insufficient documentation

## 2022-05-30 ENCOUNTER — Encounter: Payer: Self-pay | Admitting: Gastroenterology

## 2022-05-30 ENCOUNTER — Ambulatory Visit (AMBULATORY_SURGERY_CENTER): Payer: BC Managed Care – PPO

## 2022-05-30 VITALS — Ht 68.0 in | Wt 153.4 lb

## 2022-05-30 DIAGNOSIS — Z86010 Personal history of colonic polyps: Secondary | ICD-10-CM

## 2022-05-30 NOTE — Progress Notes (Signed)
No egg or soy allergy known to patient  No issues known to pt with past sedation with any surgeries or procedures Patient denies ever being told they had issues or difficulty with intubation  No FH of Malignant Hyperthermia Pt is not on diet pills Pt is not on  home 02  Pt is not on blood thinners  Pt denies issues with constipation  No A fib or A flutter Have any cardiac testing pending--no  Pt is ambulatory   Patient's chart reviewed by Louis Evans CNRA prior to previsit and patient appropriate for the [facility].  Previsit completed and red dot placed by patient's name on their procedure day (on provider's schedule).     PV completed with patient. Prep reviewed. Hard copy of instructions given at pre visit. Pt unable to tolerate suprep with his last colonoscopy. Miralax spilt dose given.

## 2022-06-04 ENCOUNTER — Encounter: Payer: Self-pay | Admitting: Certified Registered Nurse Anesthetist

## 2022-06-07 ENCOUNTER — Encounter: Payer: BC Managed Care – PPO | Admitting: Gastroenterology

## 2022-06-08 ENCOUNTER — Other Ambulatory Visit: Payer: Self-pay | Admitting: Gastroenterology

## 2022-06-08 ENCOUNTER — Ambulatory Visit (AMBULATORY_SURGERY_CENTER): Payer: BC Managed Care – PPO | Admitting: Gastroenterology

## 2022-06-08 ENCOUNTER — Encounter: Payer: Self-pay | Admitting: Gastroenterology

## 2022-06-08 VITALS — BP 102/71 | HR 62 | Temp 97.8°F | Resp 13 | Ht 66.75 in | Wt 146.6 lb

## 2022-06-08 DIAGNOSIS — Z09 Encounter for follow-up examination after completed treatment for conditions other than malignant neoplasm: Secondary | ICD-10-CM

## 2022-06-08 DIAGNOSIS — D122 Benign neoplasm of ascending colon: Secondary | ICD-10-CM | POA: Diagnosis not present

## 2022-06-08 DIAGNOSIS — D120 Benign neoplasm of cecum: Secondary | ICD-10-CM

## 2022-06-08 DIAGNOSIS — Z86010 Personal history of colonic polyps: Secondary | ICD-10-CM

## 2022-06-08 DIAGNOSIS — D124 Benign neoplasm of descending colon: Secondary | ICD-10-CM

## 2022-06-08 MED ORDER — SODIUM CHLORIDE 0.9 % IV SOLN: 500.0000 mL | INTRAVENOUS | Status: DC

## 2022-06-08 NOTE — Progress Notes (Signed)
Called to room to assist during endoscopic procedure.  Patient ID and intended procedure confirmed with present staff. Received instructions for my participation in the procedure from the performing physician.  

## 2022-06-08 NOTE — Patient Instructions (Signed)
Handouts Provided:  Polyps  YOU HAD AN ENDOSCOPIC PROCEDURE TODAY AT THE [facility]:   Refer to the procedure report that was given to you for any specific questions about what was found during the examination.  If the procedure report does not answer your questions, please call your gastroenterologist to clarify.  If you requested that your care partner not be given the details of your procedure findings, then the procedure report has been included in a sealed envelope for you to review at your convenience later.  YOU SHOULD EXPECT: Some feelings of bloating in the abdomen. Passage of more gas than usual.  Walking can help get rid of the air that was put into your GI tract during the procedure and reduce the bloating. If you had a lower endoscopy (such as a colonoscopy or flexible sigmoidoscopy) you may notice spotting of blood in your stool or on the toilet paper. If you underwent a bowel prep for your procedure, you may not have a normal bowel movement for a few days.  Please Note:  You might notice some irritation and congestion in your nose or some drainage.  This is from the oxygen used during your procedure.  There is no need for concern and it should clear up in a day or so.  SYMPTOMS TO REPORT IMMEDIATELY:  Following lower endoscopy (colonoscopy or flexible sigmoidoscopy):  Excessive amounts of blood in the stool  Significant tenderness or worsening of abdominal pains  Swelling of the abdomen that is new, acute  Fever of 100F or higher  For urgent or emergent issues, a gastroenterologist can be reached at any hour by calling [phone]. Do not use MyChart messaging for urgent concerns.    DIET:  We do recommend a small meal at first, but then you may proceed to your regular diet.  Drink plenty of fluids but you should avoid alcoholic beverages for 24 hours.  ACTIVITY:  You should plan to take it easy for the rest of today and you should NOT DRIVE or use heavy  machinery until tomorrow (because of the sedation medicines used during the test).    FOLLOW UP: Our staff will call the number listed on your records the next business day following your procedure.  We will call around 7:15- 8:00 am to check on you and address any questions or concerns that you may have regarding the information given to you following your procedure. If we do not reach you, we will leave a message.     If any biopsies were taken you will be contacted by phone or by letter within the next 1-3 weeks.  Please call us at [phone] if you have not heard about the biopsies in 3 weeks.    SIGNATURES/CONFIDENTIALITY: You and/or your care partner have signed paperwork which will be entered into your electronic medical record.  These signatures attest to the fact that that the information above on your After Visit Summary has been reviewed and is understood.  Full responsibility of the confidentiality of this discharge information lies with you and/or your care-partner.  

## 2022-06-08 NOTE — Progress Notes (Signed)
History & Physical  Primary Care Physician:  Karie Schwalbe, MD Primary Gastroenterologist: Wendall Papa, MD  Impression / Plan:  Personal history of adenomatous colon polyps for colonoscopy  CHIEF COMPLAINT:  Personal history of colon polyps   HPI: Louis Evans is a 62 y.o. male with a personal history of adenomatous colon polyps for colonoscopy.    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 Authorizing Provider  calcium carbonate (TUMS - DOSED IN MG ELEMENTAL CALCIUM) 500 MG chewable tablet Chew 1 tablet by mouth as needed for indigestion or heartburn.   Yes [provider]  levothyroxine (SYNTHROID) 100 MCG tablet TAKE 1 TABLET BY MOUTH EVERY DAY BEFORE BREAKFAST. NEEDS OFFICE VISIT 05/12/22  Yes Tillman Abide I, MD  buPROPion [facility] SR) 150 MG 12 hr tablet TAKE 1 TABLET BY MOUTH TWICE A DAY Patient not taking: Reported on 05/30/2022 05/03/22   Karie Schwalbe, MD  oxyCODONE-acetaminophen (PERCOCET/ROXICET) 5-325 MG tablet Take 1-2 tablets by mouth every 4 (four) hours as needed. 05/02/22   [provider]    Current Outpatient Medications  Medication Sig Dispense Refill   calcium carbonate (TUMS - DOSED IN MG ELEMENTAL CALCIUM) 500 MG chewable tablet Chew 1 tablet by mouth as needed for indigestion or heartburn.     levothyroxine (SYNTHROID) 100 MCG tablet TAKE 1 TABLET BY MOUTH EVERY DAY BEFORE BREAKFAST. NEEDS OFFICE VISIT 90 tablet 3    buPROPion (WELLBUTRIN SR) 150 MG 12 hr tablet TAKE 1 TABLET BY MOUTH TWICE A DAY (Patient not taking: Reported on 05/30/2022) 180 tablet 3   oxyCODONE-acetaminophen (PERCOCET/ROXICET) 5-325 MG tablet Take 1-2 tablets by mouth every 4 (four) hours as needed.     Current Facility-Administered Medications  Medication Dose Route Frequency Provider Last Rate Last Admin   0.9 %  sodium chloride infusion  500 mL Intravenous Continuous Meryl Dare, MD        Allergies as of 06/08/2022   (No Known Allergies)    Family History  Problem Relation Age of Onset   Hypertension Mother    Diabetes Mother    Diabetes Sister    CAD Neg Hx    Cancer Neg Hx        prostate or colon   Colon cancer Neg Hx    Rectal cancer Neg Hx    Stomach cancer Neg Hx     Social History   Socioeconomic History   Marital status: Married    Spouse name: Not on file   Number of children: 3   Years of education: Not on file   Highest education level: Not on file  Occupational History   Occupation: Maintenance    Employer: [employer]    Comment: Worker's comp since 11/21   Occupation:    Tobacco Use  Smoking status: Former    Types: Cigarettes    Quit date: 04/22/2022    Years since quitting: 0.1    Passive exposure: Never   Smokeless tobacco: Never  Vaping Use   Vaping Use: Never used  Substance and Sexual Activity   Alcohol use: Yes    Alcohol/week: 2.0 standard drinks of alcohol    Types: 2 Cans of beer per week   Drug use: No   Sexual activity: Not on file  Other Topics Concern   Not on file  Social History Narrative   Not on file   Social Determinants of Health   Financial Resource Strain: Not on file  Food Insecurity: Not on file  Transportation Needs: Not on file  Physical Activity: Not on file  Stress: Not on file  Social Connections: Not on file  Intimate Partner Violence: Not on file    Review of Systems:  All systems reviewed were negative except where noted in  HPI.   Physical Exam:  General:  Alert, well-developed, in NAD Head:  Normocephalic and atraumatic. Eyes:  Sclera clear, no icterus.   Conjunctiva pink. Ears:  Normal auditory acuity. Mouth:  No deformity or lesions.  Neck:  Supple; no masses. Lungs:  Clear throughout to auscultation.   No wheezes, crackles, or rhonchi.  Heart:  Regular rate and rhythm; no murmurs. Abdomen:  Soft, nondistended, nontender. No masses, hepatomegaly. No palpable masses.  Normal bowel sounds.    Rectal:  Deferred   Msk:  Symmetrical without gross deformities. Extremities:  Without edema. Neurologic:  Alert and  oriented x 4; grossly normal neurologically. Skin:  Intact without significant lesions or rashes. Psych:  Alert and cooperative. Normal mood and affect.   Venita Lick. Russella Dar  06/08/2022, 11:31 AM See Loretha Stapler, [facility], to contact our on call provider

## 2022-06-08 NOTE — Op Note (Signed)
[facility] Patient Name: Louis Evans Procedure Date: 06/08/2022 11:33 AM MRN: [id number] Endoscopist: Meryl Dare , MD, [phone] Age: 62 Referring MD:  Date of Birth: 1960/11/25 Gender: Male Account #: [id number] Procedure:                Colonoscopy Indications:              Surveillance: Personal history of adenomatous                            polyps on last colonoscopy > 5 years ago Medicines:                Monitored Anesthesia Care Procedure:                Pre-Anesthesia Assessment:                           - Prior to the procedure, a History and Physical                            was performed, and patient medications and                            allergies were reviewed. The patient's tolerance of                            previous anesthesia was also reviewed. The risks                            and benefits of the procedure and the sedation                            options and risks were discussed with the patient.                            All questions were answered, and informed consent                            was obtained. Prior Anticoagulants: The patient has                            taken no anticoagulant or antiplatelet agents. ASA                            Grade Assessment: II - A patient with mild systemic                            disease. After reviewing the risks and benefits,                            the patient was deemed in satisfactory condition to                            undergo the procedure.  After obtaining informed consent, the colonoscope                            was passed under direct vision. Throughout the                            procedure, the patient's blood pressure, pulse, and                            oxygen saturations were monitored continuously. The                            PCF-HQ190L Colonoscope [id number] was introduced                            through the anus  and advanced to the the cecum,                            identified by appendiceal orifice and ileocecal                            valve. The ileocecal valve, appendiceal orifice,                            and rectum were photographed. The quality of the                            bowel preparation was good. The colonoscopy was                            performed without difficulty. The patient tolerated                            the procedure well. Scope In: 11:38:42 AM Scope Out: 11:52:49 AM Scope Withdrawal Time: 0 hours 12 minutes 42 seconds  Total Procedure Duration: 0 hours 14 minutes 7 seconds  Findings:                 The perianal and digital rectal examinations were                            normal.                           Three sessile polyps were found in the descending                            colon, ascending colon and cecum. The polyps were 7                            to 11 mm in size. These polyps were removed with a                            cold snare. Resection and retrieval were complete.  Internal hemorrhoids were found during                            retroflexion. The hemorrhoids were small and Grade                            I (internal hemorrhoids that do not prolapse).                           The exam was otherwise without abnormality on                            direct and retroflexion views. Complications:            No immediate complications. Estimated blood loss:                            None. Estimated Blood Loss:     Estimated blood loss: none. Impression:               - Three 7 to 11 mm polyps in the descending colon,                            in the ascending colon and in the cecum, removed                            with a cold snare. Resected and retrieved.                           - Internal hemorrhoids.                           - The examination was otherwise normal on direct                             and retroflexion views. Recommendation:           - Repeat colonoscopy date to be determined after                            pending pathology results are reviewed for                            surveillance based on pathology results.                           - Patient has a contact number available for                            emergencies. The signs and symptoms of potential                            delayed complications were discussed with the                            patient. Return to normal  activities tomorrow.                            Written discharge instructions were provided to the                            patient.                           - Resume previous diet.                           - Continue present medications.                           - Await pathology results. Meryl Dare, MD 06/08/2022 11:55:43 AM This report has been signed electronically.

## 2022-06-08 NOTE — Progress Notes (Signed)
Report given to PACU, vss 

## 2022-06-09 ENCOUNTER — Telehealth: Payer: Self-pay | Admitting: *Deleted

## 2022-06-09 NOTE — Telephone Encounter (Signed)
  Follow up Call-     06/08/2022   10:50 AM  Call back number  Post procedure Call Back phone  # [phone]  Permission to leave phone message Yes     Patient questions:  Do you have a fever, pain , or abdominal swelling? No. Pain Score  0 *  Have you tolerated food without any problems? Yes.    Have you been able to return to your normal activities? Yes.    Do you have any questions about your discharge instructions: Diet   No. Medications  No. Follow up visit  No.  Do you have questions or concerns about your Care? No.  Actions: * If pain score is 4 or above: No action needed, pain <4.

## 2022-06-23 ENCOUNTER — Encounter: Payer: Self-pay | Admitting: Gastroenterology

## 2022-09-14 ENCOUNTER — Encounter: Payer: Self-pay | Admitting: Internal Medicine

## 2022-09-14 ENCOUNTER — Ambulatory Visit: Payer: BC Managed Care – PPO | Admitting: Internal Medicine

## 2022-09-14 VITALS — BP 122/80 | HR 68 | Temp 97.5°F | Ht 66.75 in | Wt 154.0 lb

## 2022-09-14 DIAGNOSIS — N138 Other obstructive and reflux uropathy: Secondary | ICD-10-CM

## 2022-09-14 DIAGNOSIS — N401 Enlarged prostate with lower urinary tract symptoms: Secondary | ICD-10-CM | POA: Diagnosis not present

## 2022-09-14 LAB — POC URINALSYSI DIPSTICK (AUTOMATED)
Bilirubin, UA: NEGATIVE
Blood, UA: NEGATIVE
Glucose, UA: NEGATIVE
Ketones, UA: NEGATIVE
Leukocytes, UA: NEGATIVE
Nitrite, UA: NEGATIVE
Protein, UA: NEGATIVE
Spec Grav, UA: 1.01 (ref 1.010–1.025)
Urobilinogen, UA: 0.2 E.U./dL
pH, UA: 6.5 (ref 5.0–8.0)

## 2022-09-14 MED ORDER — TAMSULOSIN HCL 0.4 MG PO CAPS: 0.4000 mg | ORAL_CAPSULE | Freq: Every day | ORAL | 3 refills | Status: DC

## 2022-09-14 NOTE — Assessment & Plan Note (Signed)
Clearly having symptoms again Nothing to suggest infection--urinalysis normal Will start tamsulosin again If ongoing symptoms, will go ahead with urology referral

## 2022-09-14 NOTE — Progress Notes (Signed)
Subjective:    Patient ID: Louis Evans, male    DOB: 1960/03/21, 62 y.o.   MRN: [id number]  HPI Here due to urinary issues  Got nauseated with bupropion Took for a week--then stopped it Threw out cigarettes after that Has stayed off them thus far Gained a few pounds--not a big deal  For a few weeks--getting up 3 times at night to void Slow stream--hard to get it going Occ smell on penis--like after sweating No dysuria or blood Increased frequency  Has left over tamsulosin--seemed to help but forgot it sometimes also (25-4 year old)  Also is constipated--and not emptying right Eating some different since stopped smoking  Current Outpatient Medications on File Prior to Visit  Medication Sig Dispense Refill   calcium carbonate (TUMS - DOSED IN MG ELEMENTAL CALCIUM) 500 MG chewable tablet Chew 1 tablet by mouth as needed for indigestion or heartburn.     levothyroxine (SYNTHROID) 100 MCG tablet TAKE 1 TABLET BY MOUTH EVERY DAY BEFORE BREAKFAST. NEEDS OFFICE VISIT 90 tablet 3   No current facility-administered medications on file prior to visit.    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   had 4 months of INH   Post-operative nausea and vomiting    Tobacco use disorder     Past Surgical History:  Procedure Laterality Date   arm surgery     FINGER SURGERY     right hand middle finger/ cut nerve   KNEE SURGERY     left knee   WRIST SURGERY      Family History  Problem Relation Age of Onset   Hypertension Mother    Diabetes Mother    Diabetes Sister    CAD Neg Hx    Cancer Neg Hx        prostate or colon   Colon cancer Neg Hx    Rectal cancer Neg Hx    Stomach cancer Neg Hx     Social History   Socioeconomic History   Marital status: Married    Spouse  name: Not on file   Number of children: 3   Years of education: Not on file   Highest education level: Not on file  Occupational History   Occupation: Maintenance    Employer: [employer]    Comment: Worker's comp since 11/21   Occupation:    Tobacco Use   Smoking status: Former    Current packs/day: 0.00    Types: Cigarettes    Quit date: 04/22/2022    Years since quitting: 0.3    Passive exposure: Never   Smokeless tobacco: Never  Vaping Use   Vaping status: Never Used  Substance and Sexual Activity   Alcohol use: Yes    Alcohol/week: 2.0 standard drinks of alcohol    Types: 2 Cans of beer per week   Drug use: No   Sexual activity: Not on file  Other Topics Concern   Not on file  Social History Narrative   Not on file   Social Determinants of Health   Financial Resource Strain: Not on file  Food Insecurity: Not on file  Transportation Needs: Not on file  Physical Activity: Not on file  Stress: Not on file  Social Connections: Not  on file  Intimate Partner Violence: Not on file   Review of Systems No fever No N/V Increased heartburn---since stopping smoking. Uses tums No back pain    Objective:   Physical Exam Genitourinary:    Penis: Normal.      Testes: Normal.            Assessment & Plan:

## 2022-09-14 NOTE — Addendum Note (Signed)
Addended by: Eual Fines on: 09/14/2022 09:05 AM   Modules accepted: Orders

## 2022-09-14 NOTE — Patient Instructions (Signed)
You can try miralax one capful daily in a glass of liquid or senna-S 2 tabs daily---for your bowels.  If you aren't peeing better on the tamsulosin, let me know and I will refer you to a urologist (specialist in urinary problems)

## 2022-11-28 ENCOUNTER — Ambulatory Visit (INDEPENDENT_AMBULATORY_CARE_PROVIDER_SITE_OTHER): Payer: BC Managed Care – PPO

## 2022-11-28 DIAGNOSIS — Z23 Encounter for immunization: Secondary | ICD-10-CM | POA: Diagnosis not present

## 2023-01-10 ENCOUNTER — Emergency Department (HOSPITAL_COMMUNITY): Payer: BC Managed Care – PPO

## 2023-01-10 ENCOUNTER — Emergency Department (HOSPITAL_COMMUNITY)
Admission: EM | Admit: 2023-01-10 | Discharge: 2023-01-10 | Disposition: A | Discharge status: Home / Self Care | Payer: BC Managed Care – PPO | Attending: Emergency Medicine | Admitting: Emergency Medicine

## 2023-01-10 DIAGNOSIS — M542 Cervicalgia: Secondary | ICD-10-CM | POA: Diagnosis present

## 2023-01-10 DIAGNOSIS — R519 Headache, unspecified: Secondary | ICD-10-CM | POA: Diagnosis not present

## 2023-01-10 DIAGNOSIS — Y92410 Unspecified street and highway as the place of occurrence of the external cause: Secondary | ICD-10-CM | POA: Diagnosis not present

## 2023-01-10 MED ORDER — ACETAMINOPHEN 325 MG PO TABS
650.0000 mg | ORAL_TABLET | Freq: Once | ORAL | Status: AC
Start: 2023-01-10 — End: 2023-01-10
  Administered 2023-01-10: 650 mg via ORAL
  Filled 2023-01-10: qty 2

## 2023-01-10 MED ORDER — CYCLOBENZAPRINE HCL 10 MG PO TABS: 10.0000 mg | ORAL_TABLET | Freq: Two times a day (BID) | ORAL | 0 refills | Status: DC | PRN

## 2023-01-10 MED ORDER — CYCLOBENZAPRINE HCL 10 MG PO TABS
10.0000 mg | ORAL_TABLET | Freq: Once | ORAL | Status: AC
  Administered 2023-01-10: 10 mg via ORAL
  Filled 2023-01-10: qty 1

## 2023-01-10 MED ORDER — IBUPROFEN 800 MG PO TABS
800.0000 mg | ORAL_TABLET | Freq: Once | ORAL | Status: AC
  Administered 2023-01-10: 800 mg via ORAL
  Filled 2023-01-10: qty 1
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
## 2023-01-10 NOTE — Discharge Instructions (Signed)
Please begin taking ibuprofen 600 mg or Tylenol 650 mg every 6 hours as needed.  You may alternate these medications.  You may also take muscle relaxer sent to your pharmacy twice a day as needed.  Please follow-up with your PCP next week for reevaluation.  Please return to the ED with any new or worsening signs or symptoms.
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
## 2023-01-12 ENCOUNTER — Encounter: Payer: Self-pay | Admitting: Internal Medicine

## 2023-01-12 ENCOUNTER — Ambulatory Visit: Payer: BC Managed Care – PPO | Admitting: Internal Medicine

## 2023-01-12 VITALS — BP 112/80 | HR 64 | Temp 98.4°F | Ht 66.75 in | Wt 156.0 lb

## 2023-01-12 DIAGNOSIS — S060XAA Concussion with loss of consciousness status unknown, initial encounter: Secondary | ICD-10-CM | POA: Insufficient documentation

## 2023-01-12 DIAGNOSIS — S161XXD Strain of muscle, fascia and tendon at neck level, subsequent encounter: Secondary | ICD-10-CM

## 2023-01-12 DIAGNOSIS — S060X0D Concussion without loss of consciousness, subsequent encounter: Secondary | ICD-10-CM | POA: Diagnosis not present

## 2023-01-12 DIAGNOSIS — S161XXA Strain of muscle, fascia and tendon at neck level, initial encounter: Secondary | ICD-10-CM | POA: Insufficient documentation

## 2023-01-12 MED ORDER — IBUPROFEN 800 MG PO TABS: 800.0000 mg | ORAL_TABLET | Freq: Three times a day (TID) | ORAL | 0 refills | Status: DC | PRN
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
## 2023-01-12 NOTE — Patient Instructions (Signed)
Please try the ibuprofen 800mg  --up to 3 daily with meals. Take over the counter omeprazole at bedtime daily while you are taking the ibuprofen. Heat can help with your neck Use the muscle relaxer at bedtime as needed Let me know if you have trouble concentrating or if the headache gets worse
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
## 2023-01-12 NOTE — Assessment & Plan Note (Signed)
Mild Symptoms are limited Okay to return to work on 12/30---no limitations unless symptoms change

## 2023-03-07 ENCOUNTER — Ambulatory Visit: Payer: Self-pay | Admitting: Internal Medicine

## 2023-03-07 NOTE — Telephone Encounter (Signed)
This RN made second attempt to triage patient. No answer, left a message. Will continue to attempt.

## 2023-03-07 NOTE — Telephone Encounter (Signed)
Chief Complaint: Fever Symptoms: Cough, body aches, mild headache, sore throat, fatigue Frequency: Constant Pertinent Negatives: Patient denies abdomen pain, diarrhea, earache Disposition: [] ED /[x] Urgent Care (no appt availability in office) / [] Appointment(In office/virtual)/ []  [place] [facility]/ [] Home Care/ [] Refused Recommended Disposition /[] [place] [facility]/ []  Follow-up with PCP Additional Notes: Pt states his current temperature is 103.82F. Pt states he did just get out of the hot shower. Pt states he does not fel like he is burning up. Pt states yesterday his temperature was 101.1 F. Pt advised to go to urgent care based off symptoms since no appt availability. Pt denied this due to financial concerns. This RN educated pt on home care, new-worsening symptoms, when to call back/seek emergent care. Pt verbalized understanding and agrees to plan.  Copied From CRM [id number]. Reason for Triage: Patient is having body aches, coughing, headaches, and had a fever yesterday, wanted to be seen today but no appointments   Reason for Disposition  [1] Fever > 101 F (38.3 C) AND [2] age > 60 years  Answer Assessment - Initial Assessment Questions 1. TEMPERATURE: "What is the most recent temperature?"  "How was it measured?"      103.82F after getting out of hot shower, pt states he does not feel like he is that hot 2. ONSET: "When did the fever start?"      Yesterday 3. CHILLS: "Do you have chills?" If yes: "How bad are they?"  (e.g., none, mild, moderate, severe)   - NONE: no chills   - MILD: feeling cold   - MODERATE: feeling very cold, some shivering (feels better under a thick blanket)   - SEVERE: feeling extremely cold with shaking chills (general body shaking, rigors; even under a thick blanket)      Come and go 4. OTHER SYMPTOMS: "Do you have any other symptoms besides the fever?"  (e.g., abdomen pain, cough, diarrhea, earache, headache, sore throat, urination pain)     Cough,  body aches, mild headache, sore throat 7. TREATMENT: "What have you done so far to treat this fever?" (e.g., medications)     Nyquil  Protocols used: [facility]

## 2023-03-07 NOTE — Telephone Encounter (Signed)
First attempt to contact patient. No answer so a voicemail was left with a request for call back. Office number provided.

## 2023-03-08 NOTE — Telephone Encounter (Signed)
Okay I will assess him at tomorrow's visit

## 2023-03-09 ENCOUNTER — Ambulatory Visit: Payer: Self-pay | Admitting: Internal Medicine

## 2023-03-09 ENCOUNTER — Encounter: Payer: Self-pay | Admitting: Internal Medicine

## 2023-03-09 VITALS — BP 102/78 | HR 77 | Temp 98.6°F | Ht 66.75 in | Wt 152.0 lb

## 2023-03-09 DIAGNOSIS — N138 Other obstructive and reflux uropathy: Secondary | ICD-10-CM | POA: Diagnosis not present

## 2023-03-09 DIAGNOSIS — J111 Influenza due to unidentified influenza virus with other respiratory manifestations: Secondary | ICD-10-CM | POA: Insufficient documentation

## 2023-03-09 DIAGNOSIS — N401 Enlarged prostate with lower urinary tract symptoms: Secondary | ICD-10-CM

## 2023-03-09 LAB — POC COVID19 BINAXNOW: SARS Coronavirus 2 Ag: NEGATIVE

## 2023-03-09 MED ORDER — HYDROCOD POLI-CHLORPHE POLI ER 10-8 MG/5ML PO SUER: 5.0000 mL | Freq: Two times a day (BID) | ORAL | 0 refills | Status: DC | PRN

## 2023-03-09 MED ORDER — TAMSULOSIN HCL 0.4 MG PO CAPS: 0.8000 mg | ORAL_CAPSULE | Freq: Every day | ORAL | 3 refills | Status: DC

## 2023-03-09 NOTE — Addendum Note (Signed)
Addended by: Tillman Abide I on: 03/09/2023 10:22 AM   Modules accepted: Orders, Level of Service

## 2023-03-09 NOTE — Assessment & Plan Note (Signed)
Some response to the tamsulosin---but still bothersome symptoms Will try increasing to 2 tamsulosin daily Consider adding finasteride

## 2023-03-09 NOTE — Progress Notes (Signed)
Subjective:    Patient ID: Louis Evans, male    DOB: [birth year], 63 y.o.   MRN: [id number]  HPI Here due to respiratory illness  Started not feeling right 3 days ago--but worked Next day --feeling tired Working in the cold--filling brine trucks Fever 103.4 yesterday--but just out of shower. Then down to 101 No fever now  Falling asleep Feels sore all over Did have drenching sweats the past 2 nights Cough now--worsening. Phlegm is white No SOB Some headache--frontal Some sore throat from the cough No ear pain  Taking nyquil and ibuprofen--some help for headache Vick's topical  Still having trouble voiding Some help with tamsulosin--but ongoing slow stream, etc Current Outpatient Medications on File Prior to Visit  Medication Sig Dispense Refill   calcium carbonate (TUMS - DOSED IN MG ELEMENTAL CALCIUM) 500 MG chewable tablet Chew 1 tablet by mouth as needed for indigestion or heartburn.     cyclobenzaprine (FLEXERIL) 10 MG tablet Take 1 tablet (10 mg total) by mouth 2 (two) times daily as needed for muscle spasms. 20 tablet 0   ibuprofen (ADVIL) 800 MG tablet Take 1 tablet (800 mg total) by mouth every 8 (eight) hours as needed. 100 tablet 0   levothyroxine (SYNTHROID) 100 MCG tablet TAKE 1 TABLET BY MOUTH EVERY DAY BEFORE BREAKFAST. NEEDS OFFICE VISIT 90 tablet 3   tamsulosin (FLOMAX) 0.4 MG CAPS capsule Take 1 capsule (0.4 mg total) by mouth daily. 90 capsule 3   No current facility-administered medications on file prior to visit.    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   had 4 months of INH   Post-operative nausea and vomiting    Tobacco use disorder     Past Surgical History:  Procedure Laterality Date   arm surgery     FINGER SURGERY     right hand middle  finger/ cut nerve   KNEE SURGERY     left knee   WRIST SURGERY      Family History  Problem Relation Age of Onset   Hypertension Mother    Diabetes Mother    Diabetes Sister    CAD Neg Hx    Cancer Neg Hx        prostate or colon   Colon cancer Neg Hx    Rectal cancer Neg Hx    Stomach cancer Neg Hx     Social History   Socioeconomic History   Marital status: Married    Spouse name: Not on file   Number of children: 3   Years of education: Not on file   Highest education level: Not on file  Occupational History   Occupation: Maintenance    Employer: [employer]    Comment: Worker's comp since 11/21   Occupation:    Tobacco Use   Smoking status: Former    Current packs/day: 0.00    Types: Cigarettes    Quit date: 04/22/2022    Years since quitting: 0.8    Passive exposure: Never   Smokeless tobacco: Never  Vaping Use   Vaping status: Never Used  Substance and Sexual Activity   Alcohol use: Yes    Alcohol/week: 2.0 standard drinks of alcohol    Types: 2 Cans of beer per  week   Drug use: No   Sexual activity: Not on file  Other Topics Concern   Not on file  Social History Narrative   Not on file   Social Drivers of Health   Financial Resource Strain: Not on file  Food Insecurity: Not on file  Transportation Needs: Not on file  Physical Activity: Not on file  Stress: Not on file  Social Connections: Not on file  Intimate Partner Violence: Not on file   Review of Systems Taste is off Nausea/vomiting---just with cough Able to eat but appetite is off    Objective:   Physical Exam Constitutional:      Appearance: Normal appearance.  HENT:     Head:     Comments: No sinus tenderness    Right Ear: Tympanic membrane and ear canal normal.     Left Ear: Tympanic membrane and ear canal normal.     Mouth/Throat:     Pharynx: No oropharyngeal exudate or posterior oropharyngeal erythema.  Pulmonary:     Effort: Pulmonary effort is normal.     Breath  sounds: No wheezing or rales.     Comments: Fair air movement--normal exp phase but some widespread exp rhonchi Musculoskeletal:     Cervical back: Neck supple.  Lymphadenopathy:     Cervical: No cervical adenopathy.  Neurological:     Mental Status: He is alert.            Assessment & Plan:

## 2023-03-09 NOTE — Assessment & Plan Note (Signed)
Typical flu like symptoms---but too late for tamiflu COVID negative Will give out of work note till 2/24 Tussionex for prn Consider prednisone if cough doesn't go away---and doxy next week if settles in sinuses

## 2023-03-09 NOTE — Addendum Note (Signed)
Addended by: Eual Fines on: 03/09/2023 10:29 AM   Modules accepted: Orders

## 2023-03-13 ENCOUNTER — Ambulatory Visit: Payer: Self-pay | Admitting: Internal Medicine

## 2023-03-13 ENCOUNTER — Encounter: Payer: Self-pay | Admitting: Internal Medicine

## 2023-03-13 MED ORDER — PREDNISONE 20 MG PO TABS: 40.0000 mg | ORAL_TABLET | Freq: Every day | ORAL | 0 refills | Status: DC

## 2023-03-13 MED ORDER — DOXYCYCLINE HYCLATE 100 MG PO TABS: 100.0000 mg | ORAL_TABLET | Freq: Two times a day (BID) | ORAL | 0 refills | Status: DC

## 2023-03-13 NOTE — Telephone Encounter (Signed)
 There is another communication with the pt about the issues he is still having. Will close this note and continue in that MyChart.

## 2023-03-13 NOTE — Telephone Encounter (Signed)
  Copied From CRM [id number]. Reason for Triage: Patient seen Dr. Alphonsus Sias 2/20 and tested positive for the flu and given cough meds, was told to call back if he was not feeling well by Sunday 2/24 - says he's still not feeling well, he's extremely tired, congested and having the chills and doesn't know what to do     Chief Complaint: flu symptoms, persistent Symptoms: cough, runny nose, congestion, fatigue Frequency: constant Pertinent Negatives: Patient denies shortness of breath Disposition: [] ED /[] Urgent Care (no appt availability in office) / [] Appointment(In office/virtual)/ []  [place] [facility]/ [] Home Care/ [] Refused Recommended Disposition /[] [place] [facility]/ [x]  Follow-up with PCP Additional Notes: Pt seen on 2/20, tested negative for Covid, but was told he likely had the flu although to late to start Tamiflu. Pt was prescribed Tussionex for cough prn. Pt reports cough improving but he is almost out of that medication. Per Dr. Alphonsus Sias on 2/20 he would "consider prednisone if cough doesn't go away- and doxy next week if settles in sinuses". No scheduled appt at this time. Pt wanted to update physician on symptoms and will wait to see if Dr. Alphonsus Sias wants to see him again OR order medications and do a later follow-up.   Reason for Disposition  [1] HIGH RISK (e.g., age > 64 years, pregnant, HIV+, or chronic medical condition) AND [2]  > 72 hours (3 days) since evaluated by doctor (or NP/PA) AND [3] symptoms not improved  Answer Assessment - Initial Assessment Questions 1. DIAGNOSIS CONFIRMATION: "When was the influenza diagnosed?" "By whom?" "Did you get a test for it?"     Dr. Alphonsus Sias; on 2/20, was not tested, but per patient physician was sure it was the flue although too late in the course of symptoms to take Tamiffu  2. MEDICINES: "Were you prescribed any medications for the influenza?"  (e.g., zanamivir [Relenza], oseltamavir [Tamiflu]).      No  3. ONSET of SYMPTOMS: "When  did your symptoms start?"     Last week  4. SYMPTOMS: "What symptoms are you most concerned about?" (e.g., runny nose, stuffy nose, sore throat, cough, breathing difficulty, fever)     Congestion and fatigue  5. COUGH: "How bad is the cough?"     Cough is getting better with PRN medication   6. FEVER: "Do you have a fever?" If Yes, ask: "What is your temperature, how was it measured, and when did it start?"     No fever  7. RESPIRATORY DISTRESS: "Are you having any trouble breathing?" If Yes, ask: "Describe your breathing."      No   8. FLU VACCINE: "Did you receive a flu shot this year?" (e.g., seasonal influenza, H1N1)     Yes, late last year  3. PREGNANCY: "Is there any chance you are pregnant?" "When was your last menstrual period?"     N/a  10. HIGH RISK for COMPLICATIONS: "Do you have any heart or lung problems? Do you have a weakened immune system?" (e.g., CHF, COPD, asthma, HIV positive, chemotherapy, renal failure, diabetes mellitus, sickle cell anemia)       No  Protocols used: Influenza Follow-up [facility]

## 2023-03-13 NOTE — Telephone Encounter (Signed)
 Please let him know that I sent the prescriptions for him If he is not better in the next few days, he should come back in

## 2023-03-15 ENCOUNTER — Encounter: Payer: Self-pay | Admitting: Internal Medicine

## 2023-03-15 ENCOUNTER — Ambulatory Visit: Payer: 59 | Admitting: Internal Medicine

## 2023-03-15 VITALS — BP 110/80 | HR 60 | Temp 98.2°F | Ht 66.75 in | Wt 151.0 lb

## 2023-03-15 DIAGNOSIS — J111 Influenza due to unidentified influenza virus with other respiratory manifestations: Secondary | ICD-10-CM | POA: Diagnosis not present

## 2023-03-15 MED ORDER — HYDROCOD POLI-CHLORPHE POLI ER 10-8 MG/5ML PO SUER: 5.0000 mL | Freq: Two times a day (BID) | ORAL | 0 refills | Status: DC | PRN

## 2023-03-15 NOTE — Progress Notes (Signed)
 Subjective:    Patient ID: Louis Evans, male    DOB: [birth year], 63 y.o.   MRN: [id number]  HPI Here for follow up of ongoing respiratory illness  Hadn't improved so doxycycline and prednisone sent this week Just started them yesterday Hadn't been able to get back to work yet Did have sleep problems last night--took prednisone in evening instead of morning  Does feel his chest some clearer since the doxycycline  Cough is persistent---tussionex does help (but is out)  No fever No SOB  Current Outpatient Medications on File Prior to Visit  Medication Sig Dispense Refill   calcium carbonate (TUMS - DOSED IN MG ELEMENTAL CALCIUM) 500 MG chewable tablet Chew 1 tablet by mouth as needed for indigestion or heartburn.     chlorpheniramine-HYDROcodone (TUSSIONEX) 10-8 MG/5ML Take 5 mLs by mouth 2 (two) times daily as needed for cough. 70 mL 0   cyclobenzaprine (FLEXERIL) 10 MG tablet Take 1 tablet (10 mg total) by mouth 2 (two) times daily as needed for muscle spasms. 20 tablet 0   doxycycline (VIBRA-TABS) 100 MG tablet Take 1 tablet (100 mg total) by mouth 2 (two) times daily. 14 tablet 0   ibuprofen (ADVIL) 800 MG tablet Take 1 tablet (800 mg total) by mouth every 8 (eight) hours as needed. 100 tablet 0   levothyroxine (SYNTHROID) 100 MCG tablet TAKE 1 TABLET BY MOUTH EVERY DAY BEFORE BREAKFAST. NEEDS OFFICE VISIT 90 tablet 3   predniSONE (DELTASONE) 20 MG tablet Take 2 tablets (40 mg total) by mouth daily. For 3 days, then 1 tab daily for 3 days 9 tablet 0   tamsulosin (FLOMAX) 0.4 MG CAPS capsule Take 2 capsules (0.8 mg total) by mouth daily. 180 capsule 3   No current facility-administered medications on file prior to visit.    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   had 4 months  of INH   Post-operative nausea and vomiting    Tobacco use disorder     Past Surgical History:  Procedure Laterality Date   arm surgery     FINGER SURGERY     right hand middle finger/ cut nerve   KNEE SURGERY     left knee   WRIST SURGERY      Family History  Problem Relation Age of Onset   Hypertension Mother    Diabetes Mother    Diabetes Sister    CAD Neg Hx    Cancer Neg Hx        prostate or colon   Colon cancer Neg Hx    Rectal cancer Neg Hx    Stomach cancer Neg Hx     Social History   Socioeconomic History   Marital status: Married    Spouse name: Not on file   Number of children: 3   Years of education: Not on file   Highest education level: Not on file  Occupational History   Occupation: Maintenance    Employer: [employer]    Comment: Worker's comp since 11/21   Occupation:    Tobacco Use   Smoking status: Former    Current packs/day: 0.00    Types: Cigarettes    Quit date: 04/22/2022    Years since quitting: 0.8  Passive exposure: Never   Smokeless tobacco: Never  Vaping Use   Vaping status: Never Used  Substance and Sexual Activity   Alcohol use: Yes    Alcohol/week: 2.0 standard drinks of alcohol    Types: 2 Cans of beer per week   Drug use: No   Sexual activity: Not on file  Other Topics Concern   Not on file  Social History Narrative   Not on file   Social Drivers of Health   Financial Resource Strain: Not on file  Food Insecurity: Not on file  Transportation Needs: Not on file  Physical Activity: Not on file  Stress: Not on file  Social Connections: Not on file  Intimate Partner Violence: Not on file   Review of Systems Just not ready to get back to work No N/V Eating some--but fills up quickly    Objective:   Physical Exam Constitutional:      Appearance: Normal appearance.  HENT:     Mouth/Throat:     Pharynx: No oropharyngeal exudate or posterior oropharyngeal erythema.  Pulmonary:     Effort: Pulmonary  effort is normal.     Breath sounds: Normal breath sounds. No wheezing or rales.  Musculoskeletal:     Cervical back: Neck supple.  Lymphadenopathy:     Cervical: No cervical adenopathy.  Neurological:     Mental Status: He is alert.            Assessment & Plan:

## 2023-03-15 NOTE — Assessment & Plan Note (Signed)
 Apparent secondary sinus infection---now finally improving after starting the doxy and prednisone Refill the tussionex OOW till Monday-- 3/3

## 2023-04-12 ENCOUNTER — Encounter: Payer: Self-pay | Admitting: Internal Medicine

## 2023-04-12 ENCOUNTER — Ambulatory Visit (INDEPENDENT_AMBULATORY_CARE_PROVIDER_SITE_OTHER): Payer: BC Managed Care – PPO | Admitting: Internal Medicine

## 2023-04-12 VITALS — BP 104/76 | HR 66 | Temp 98.1°F | Ht 66.75 in | Wt 148.0 lb

## 2023-04-12 DIAGNOSIS — Z125 Encounter for screening for malignant neoplasm of prostate: Secondary | ICD-10-CM

## 2023-04-12 DIAGNOSIS — Z114 Encounter for screening for human immunodeficiency virus [HIV]: Secondary | ICD-10-CM | POA: Diagnosis not present

## 2023-04-12 DIAGNOSIS — E039 Hypothyroidism, unspecified: Secondary | ICD-10-CM | POA: Diagnosis not present

## 2023-04-12 DIAGNOSIS — Z0000 Encounter for general adult medical examination without abnormal findings: Secondary | ICD-10-CM

## 2023-04-12 DIAGNOSIS — Z1159 Encounter for screening for other viral diseases: Secondary | ICD-10-CM | POA: Diagnosis not present

## 2023-04-12 DIAGNOSIS — N401 Enlarged prostate with lower urinary tract symptoms: Secondary | ICD-10-CM

## 2023-04-12 DIAGNOSIS — N138 Other obstructive and reflux uropathy: Secondary | ICD-10-CM

## 2023-04-12 LAB — LIPID PANEL
Cholesterol: 213 mg/dL — ABNORMAL HIGH (ref 0–200)
HDL: 58.5 mg/dL (ref >39.00)
LDL Cholesterol: 141 mg/dL — ABNORMAL HIGH (ref 0–99)
NonHDL: 154.59
Total CHOL/HDL Ratio: 4
Triglycerides: 66 mg/dL (ref 0.0–149.0)
VLDL: 13.2 mg/dL (ref 0.0–40.0)

## 2023-04-12 LAB — CBC
HCT: 43.2 % (ref 39.0–52.0)
Hemoglobin: 14 g/dL (ref 13.0–17.0)
MCHC: 32.3 g/dL (ref 30.0–36.0)
MCV: 78.2 fl (ref 78.0–100.0)
Platelets: 293 10*3/uL (ref 150.0–400.0)
RBC: 5.52 Mil/uL (ref 4.22–5.81)
RDW: 15.7 % — ABNORMAL HIGH (ref 11.5–15.5)
WBC: 6.8 10*3/uL (ref 4.0–10.5)

## 2023-04-12 LAB — COMPREHENSIVE METABOLIC PANEL WITH GFR
ALT: 14 U/L (ref 0–53)
AST: 17 U/L (ref 0–37)
Albumin: 4.2 g/dL (ref 3.5–5.2)
Alkaline Phosphatase: 70 U/L (ref 39–117)
BUN: 14 mg/dL (ref 6–23)
CO2: 30 meq/L (ref 19–32)
Calcium: 9.8 mg/dL (ref 8.4–10.5)
Chloride: 104 meq/L (ref 96–112)
Creatinine, Ser: 1.46 mg/dL (ref 0.40–1.50)
GFR: 50.63 mL/min — ABNORMAL LOW (ref >60.00)
Glucose, Bld: 87 mg/dL (ref 70–99)
Potassium: 5.5 meq/L — ABNORMAL HIGH (ref 3.5–5.1)
Sodium: 139 meq/L (ref 135–145)
Total Bilirubin: 1.4 mg/dL — ABNORMAL HIGH (ref 0.2–1.2)
Total Protein: 6.8 g/dL (ref 6.0–8.3)

## 2023-04-12 MED ORDER — FINASTERIDE 5 MG PO TABS: 5.0000 mg | ORAL_TABLET | Freq: Every day | ORAL | 3 refills | Status: AC

## 2023-04-12 NOTE — Assessment & Plan Note (Signed)
 Healthy Gave up smoking---discussed lung cancer screening (he wants to defer) Got sick with flu vaccine--but recommended this and COVID vaccine Colon due again 2027 Check PSA

## 2023-04-12 NOTE — Assessment & Plan Note (Signed)
 Will check labs on levothyroxine 100 mcg

## 2023-04-12 NOTE — Assessment & Plan Note (Signed)
 Worse trouble at night---will add finasteride 5mg  to tamsulosin  If not better--will refer to urology
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
## 2023-04-13 ENCOUNTER — Encounter: Payer: Self-pay | Admitting: Internal Medicine

## 2023-04-13 LAB — PSA: PSA: 1.96 ng/mL (ref 0.10–4.00)

## 2023-04-13 LAB — HEPATITIS C ANTIBODY: Hepatitis C Ab: NONREACTIVE

## 2023-04-13 LAB — HIV ANTIBODY (ROUTINE TESTING W REFLEX): HIV 1&2 Ab, 4th Generation: NONREACTIVE

## 2023-04-13 LAB — TSH: TSH: 0.83 u[IU]/mL (ref 0.35–5.50)

## 2023-04-14 ENCOUNTER — Other Ambulatory Visit: Payer: Self-pay | Admitting: Internal Medicine

## 2023-04-14 DIAGNOSIS — E875 Hyperkalemia: Secondary | ICD-10-CM

## 2023-04-21 ENCOUNTER — Other Ambulatory Visit (INDEPENDENT_AMBULATORY_CARE_PROVIDER_SITE_OTHER)

## 2023-04-21 DIAGNOSIS — E875 Hyperkalemia: Secondary | ICD-10-CM | POA: Diagnosis not present

## 2023-04-21 NOTE — Addendum Note (Signed)
 Addended by: Alvina Chou on: 04/21/2023 03:51 PM   Modules accepted: Orders

## 2023-04-22 ENCOUNTER — Encounter: Payer: Self-pay | Admitting: Internal Medicine

## 2023-04-22 LAB — POTASSIUM: Potassium: 4 mmol/L (ref 3.5–5.3)

## 2023-06-07 ENCOUNTER — Other Ambulatory Visit: Payer: Self-pay | Admitting: Internal Medicine

## 2023-08-22 ENCOUNTER — Encounter: Payer: Self-pay | Admitting: Internal Medicine

## 2023-08-22 ENCOUNTER — Ambulatory Visit (INDEPENDENT_AMBULATORY_CARE_PROVIDER_SITE_OTHER): Admitting: Internal Medicine

## 2023-08-22 ENCOUNTER — Ambulatory Visit: Payer: Self-pay | Admitting: Internal Medicine

## 2023-08-22 VITALS — BP 104/78 | HR 70 | Temp 98.2°F | Ht 66.75 in | Wt 153.0 lb

## 2023-08-22 DIAGNOSIS — U071 COVID-19: Secondary | ICD-10-CM | POA: Insufficient documentation

## 2023-08-22 DIAGNOSIS — J029 Acute pharyngitis, unspecified: Secondary | ICD-10-CM

## 2023-08-22 LAB — POC COVID19 BINAXNOW: SARS Coronavirus 2 Ag: POSITIVE — AB

## 2023-08-22 MED ORDER — NIRMATRELVIR/RITONAVIR (PAXLOVID) TABLET (RENAL DOSING): 2.0000 | ORAL_TABLET | Freq: Two times a day (BID) | ORAL | 0 refills | Status: AC

## 2023-08-22 NOTE — Assessment & Plan Note (Signed)
 Mild infection but moderate risk Will try the paxlovid  Ibuprofen  prn ER if worsens Out of work till 8/11

## 2023-08-22 NOTE — Progress Notes (Signed)
 Subjective:    Patient ID: Louis Evans, male    DOB: May 02, 1960, 63 y.o.   MRN: [id number]  HPI Here due to respiratory illness  Trouble sleeping 2 nights ago Woke yesterday--some nausea and then was vomiting Wife also sick Now having chest symptoms, tired, feels weak No fever, but had chills then felt hot last night Dry cough---no SOB Head congestion and headache Some sore throat No ear pain  No meds other than ibuprofen   Current Outpatient Medications on File Prior to Visit  Medication Sig Dispense Refill   calcium carbonate (TUMS - DOSED IN MG ELEMENTAL CALCIUM) 500 MG chewable tablet Chew 1 tablet by mouth as needed for indigestion or heartburn.     finasteride  (PROSCAR ) 5 MG tablet Take 1 tablet (5 mg total) by mouth daily. 90 tablet 3   levothyroxine  (SYNTHROID ) 100 MCG tablet TAKE 1 TABLET BY MOUTH EVERY DAY BEFORE BREAKFAST. NEEDS OFFICE VISIT 90 tablet 3   tamsulosin  (FLOMAX ) 0.4 MG CAPS capsule Take 0.4 mg by mouth 2 (two) times daily.     No current facility-administered medications on file prior to visit.    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   had 4 months of INH   Post-operative nausea and vomiting    Tobacco use disorder     Past Surgical History:  Procedure Laterality Date   arm surgery     FINGER SURGERY     right hand middle finger/ cut nerve   KNEE SURGERY     left knee   WRIST SURGERY      Family History  Problem Relation Age of Onset   Hypertension Mother    Diabetes Mother    Diabetes Sister    CAD Neg Hx    Cancer Neg Hx        prostate or colon   Colon cancer Neg Hx    Rectal cancer Neg Hx    Stomach cancer Neg Hx     Social History   Socioeconomic History   Marital status: Married    Spouse name: Not on file   Number of  children: 3   Years of education: Not on file   Highest education level: Not on file  Occupational History   Occupation: Maintenance    Employer: [employer]    Comment: Worker's comp since 11/21   Occupation:     Occupation: Drives truck    Comment: [employer]  Tobacco Use   Smoking status: Former    Current packs/day: 0.00    Types: Cigarettes    Quit date: 04/22/2022    Years since quitting: 1.3    Passive exposure: Never   Smokeless tobacco: Never  Vaping Use   Vaping status: Never Used  Substance and Sexual Activity   Alcohol use: Yes    Alcohol/week: 2.0 standard drinks of alcohol    Types: 2 Cans of beer per week   Drug use: No   Sexual activity: Not on file  Other Topics Concern   Not on file  Social History Narrative   Not on file   Social Drivers of Health   Financial Resource Strain: Not on file  Food Insecurity: Not on file  Transportation Needs: Not on file  Physical  Activity: Not on file  Stress: Not on file  Social Connections: Not on file  Intimate Partner Violence: Not on file   Review of Systems No loss of smell --but taste is off No further vomiting Eating very little    Objective:   Physical Exam Constitutional:      Appearance: Normal appearance.  HENT:     Head:     Comments: No sinus tenderness    Right Ear: Tympanic membrane and ear canal normal.     Left Ear: Tympanic membrane and ear canal normal.     Mouth/Throat:     Pharynx: No oropharyngeal exudate or posterior oropharyngeal erythema.  Pulmonary:     Effort: Pulmonary effort is normal.     Breath sounds: Normal breath sounds. No wheezing or rales.  Musculoskeletal:     Cervical back: Neck supple.  Lymphadenopathy:     Cervical: No cervical adenopathy.  Neurological:     Mental Status: He is alert.            Assessment & Plan:

## 2023-09-28 ENCOUNTER — Other Ambulatory Visit: Payer: Self-pay | Admitting: *Deleted

## 2023-09-28 MED ORDER — TAMSULOSIN HCL 0.4 MG PO CAPS: 0.4000 mg | ORAL_CAPSULE | Freq: Two times a day (BID) | ORAL | 1 refills | Status: AC

## 2023-10-09 ENCOUNTER — Encounter: Payer: Self-pay | Admitting: Family Medicine

## 2023-10-09 ENCOUNTER — Ambulatory Visit: Admitting: Family Medicine

## 2023-10-09 ENCOUNTER — Ambulatory Visit: Payer: Self-pay

## 2023-10-09 VITALS — BP 110/72 | HR 75 | Temp 97.1°F | Ht 66.75 in | Wt 149.5 lb

## 2023-10-09 DIAGNOSIS — B880 Other acariasis: Secondary | ICD-10-CM

## 2023-10-09 MED ORDER — CLOBETASOL PROPIONATE 0.05 % EX OINT: 1.0000 | TOPICAL_OINTMENT | Freq: Two times a day (BID) | CUTANEOUS | 1 refills | Status: AC

## 2023-10-09 NOTE — Progress Notes (Signed)
 Louis Gammell [name], MD, CAQ Sports Medicine [facility] [address] [place], [address]  Phone: [phone]  FAX: [phone]  Louis Evans - 63 y.o. male  MRN [id number]  Date of Birth: 1960-08-08  Date: 10/09/2023  PCP: Louis Charlie FERNS, MD  Referral: Louis Charlie FERNS, MD  Chief Complaint  Patient presents with   Rash    Vs. Insect Bites   Dizziness   Subjective:   Louis Evans is a 63 y.o. very pleasant male patient with Body mass index is 23.59 kg/m. who presents with the following:  Discussed the use of AI scribe software for clinical note transcription with the patient, who gave verbal consent to proceed.  History of Present Illness Louis Evans is a 63 year old male who presents with a rash and itching after outdoor work.  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 The rash consists of small whiteheads similar to pimples, which his daughter has been popping, releasing a clear fluid. The rash is intensely itchy. He suspects it may be due to insect bites, possibly chiggers. No new medications, soaps, lotions, or detergents have been introduced.  He has been using Clobetasl from a previous allergic reaction, which he finds more effective than a 1% cream. Benadryl is taken for the itching, providing some relief. He recalls a past severe reaction to a medication that caused blistering, for which a dermatologist prescribed a stronger cream.  He experiences lightheadedness on three occasions while working outdoors, particularly in hot weather. He denies taking any blood pressure medications.    Review of Systems is noted in the HPI, as appropriate  Objective:   BP 110/72   Pulse 75   Temp (!) 97.1 F (36.2 C) (Temporal)   Ht 5' 6.75 (1.695 m)   Wt 149 lb 8 oz (67.8 kg)   SpO2 98%   BMI 23.59 kg/m   GEN: No acute distress;  alert,appropriate. PULM: Breathing comfortably in no respiratory distress PSYCH: Normally interactive.        Physical Exam   Laboratory and Imaging Data:  Assessment and Plan:     ICD-10-CM   1. Chigger bites  B88.0      Assessment & Plan Pruritic rash due to chigger bites Pruritic rash likely due to chigger bites, characterized by small whiteheads and clear fluid discharge. Rash is intensely itchy and located on the lower extremities. Differential diagnosis includes insect bites, but presentation is not consistent with mosquito or spider bites. Current treatment with a stronger topical cream and Benadryl has been somewhat effective. - Prescribe 60 grams of Clobetasol  - Recommend taking non-drowsy antihistamine (e.g., Zyrtec) during the day. - Advise taking Benadryl at night for itch relief. - Instruct to avoid further exposure to chiggers. - If symptoms persist or worsen by Wednesday, consider prescribing oral steroids.  Lightheadedness during exertion in heat Intermittent lightheadedness during exertion in hot weather, likely related to dehydration or electrolyte imbalance due to heat exposure. - Advise increasing fluid intake, including water and electrolyte solutions like Gatorade or Powerade, especially during hot weather.  Medication Management during today's office visit: Meds ordered this encounter  Medications   clobetasol  ointment (TEMOVATE ) 0.05 %    Sig: Apply 1 Application topically 2 (two) times daily.    Dispense:  60 g    Refill:  1   There are no discontinued medications.  Orders placed today for conditions managed today:  No orders of the defined types were placed in this encounter.   Disposition: No follow-ups on file.  [employer] speech-to-text software was used for transcription in this dictation.  Possible transcriptional errors can occur using Animal nutritionist.   Signed,  Louis DASEN. Macario Shear, MD   Outpatient Encounter Medications as  of 10/09/2023  Medication Sig   calcium carbonate (TUMS - DOSED IN MG ELEMENTAL CALCIUM) 500 MG chewable tablet Chew 1 tablet by mouth as needed for indigestion or heartburn.   clobetasol  ointment (TEMOVATE ) 0.05 % Apply 1 Application topically 2 (two) times daily.   finasteride  (PROSCAR ) 5 MG tablet Take 1 tablet (5 mg total) by mouth daily.   levothyroxine  (SYNTHROID ) 100 MCG tablet TAKE 1 TABLET BY MOUTH EVERY DAY BEFORE BREAKFAST. NEEDS OFFICE VISIT   tamsulosin  (FLOMAX ) 0.4 MG CAPS capsule Take 1 capsule (0.4 mg total) by mouth 2 (two) times daily.   No facility-administered encounter medications on file as of 10/09/2023.

## 2023-10-09 NOTE — Patient Instructions (Addendum)
 Zyrtec (Certizine) one tablet a day in the morning  Use the Clobetasol  twice a day

## 2023-10-09 NOTE — Telephone Encounter (Signed)
 FYI Only or Action Required?: FYI only for provider.  Patient was last seen in primary care on 08/22/2023 by Jimmy Charlie FERNS, MD.  Called Nurse Triage reporting Insect Bite and Pruritis.  Symptoms began several days ago.  Interventions attempted: Other: his daughter's Clobetasol  Propionate ointment.  Symptoms are: unchanged.  Triage Disposition: See PCP When Office is Open (Within 3 Days)  Patient/caregiver understands and will follow disposition?: Yes                             Copied from CRM #[id number]. Topic: Clinical - Red Word Triage >> Oct 09, 2023  8:35 AM Adelita E wrote: [facility] that prompted transfer to Nurse Triage: Patient was picking up debris on Saturday and got bit up by mosquitos (potentially) and is itchy all over his body. Reason for Disposition  [1] SEVERE local itching (e.g., interferes with work, school, sleep) AND [2] not improved after 24 hours of hydrocortisone cream  Answer Assessment - Initial Assessment Questions 1. TYPE of INSECT: What type of insect was it?      Unsure, maybe mosquitoes  2. ONSET: When did you get bitten?      Over the weekend 3. LOCATION: Where is the insect bite located?      Back, arms and stomach 4. REDNESS: Is the area red or pink? If Yes, ask: What size is the area of redness? (inches or cm). When did the redness start?     Spots are red  5. PAIN: Is there any pain? If Yes, ask: How bad is the pain? (Scale 0-10; or none, mild, moderate, severe)     Denies 6. ITCHING: Does it itch? If Yes, ask: How bad is the itch?      Itching like crazy 7. SWELLING: How big is the swelling? (e.g., inches, cm, or compare to coins)     Yes, states each bite is raised up 8. OTHER SYMPTOMS: Do you have any other symptoms?  (e.g., difficulty breathing, fever, hives)     Clear drainage after popping bites, lightheadedness x 3 while working/walking outside in the heat this weekend, denies  difficulty breathing, denies fever, denies facial swelling  Protocols used: Insect Bite-A-AH

## 2024-04-12 ENCOUNTER — Encounter: Admitting: Nurse Practitioner
